# Patient Record
Sex: Female | Born: 1990 | Race: Black or African American | Hispanic: No | Marital: Single | Smoking: Never smoker
Health system: Southern US, Community
[De-identification: ages and names within clinical notes are randomized; demographics above are authoritative.]

## PROBLEM LIST (undated history)

## (undated) ENCOUNTER — Inpatient Hospital Stay (HOSPITAL_COMMUNITY): Payer: Self-pay

## (undated) DIAGNOSIS — A64 Unspecified sexually transmitted disease: Secondary | ICD-10-CM

## (undated) DIAGNOSIS — R7303 Prediabetes: Secondary | ICD-10-CM

## (undated) DIAGNOSIS — D649 Anemia, unspecified: Secondary | ICD-10-CM

## (undated) DIAGNOSIS — N760 Acute vaginitis: Secondary | ICD-10-CM

## (undated) DIAGNOSIS — R87629 Unspecified abnormal cytological findings in specimens from vagina: Secondary | ICD-10-CM

## (undated) DIAGNOSIS — B9689 Other specified bacterial agents as the cause of diseases classified elsewhere: Secondary | ICD-10-CM

## (undated) DIAGNOSIS — A599 Trichomoniasis, unspecified: Secondary | ICD-10-CM

## (undated) HISTORY — DX: Trichomoniasis, unspecified: A59.9

## (undated) HISTORY — DX: Unspecified abnormal cytological findings in specimens from vagina: R87.629

## (undated) HISTORY — DX: Other specified bacterial agents as the cause of diseases classified elsewhere: B96.89

## (undated) HISTORY — DX: Unspecified sexually transmitted disease: A64

## (undated) HISTORY — DX: Acute vaginitis: N76.0

---

## 2013-09-15 ENCOUNTER — Emergency Department (INDEPENDENT_AMBULATORY_CARE_PROVIDER_SITE_OTHER)
Admission: EM | Admit: 2013-09-15 | Discharge: 2013-09-15 | Disposition: A | Discharge status: Home / Self Care | Payer: Self-pay | Source: Home / Self Care

## 2013-09-15 ENCOUNTER — Encounter (HOSPITAL_COMMUNITY): Payer: Self-pay | Admitting: Emergency Medicine

## 2013-09-15 ENCOUNTER — Other Ambulatory Visit (HOSPITAL_COMMUNITY)
Admission: RE | Admit: 2013-09-15 | Discharge: 2013-09-15 | Disposition: A | Discharge status: Home / Self Care | Payer: Self-pay | Source: Ambulatory Visit | Attending: Family Medicine | Admitting: Family Medicine

## 2013-09-15 DIAGNOSIS — N941 Unspecified dyspareunia: Secondary | ICD-10-CM

## 2013-09-15 DIAGNOSIS — N760 Acute vaginitis: Secondary | ICD-10-CM | POA: Insufficient documentation

## 2013-09-15 DIAGNOSIS — IMO0002 Reserved for concepts with insufficient information to code with codable children: Secondary | ICD-10-CM

## 2013-09-15 DIAGNOSIS — N898 Other specified noninflammatory disorders of vagina: Secondary | ICD-10-CM

## 2013-09-15 DIAGNOSIS — Z113 Encounter for screening for infections with a predominantly sexual mode of transmission: Secondary | ICD-10-CM | POA: Insufficient documentation

## 2013-09-15 DIAGNOSIS — Z202 Contact with and (suspected) exposure to infections with a predominantly sexual mode of transmission: Secondary | ICD-10-CM

## 2013-09-15 LAB — POCT PREGNANCY, URINE: PREG TEST UR: NEGATIVE

## 2013-09-15 LAB — POCT URINALYSIS DIP (DEVICE)
Bilirubin Urine: NEGATIVE
Glucose, UA: NEGATIVE mg/dL
Ketones, ur: NEGATIVE mg/dL
LEUKOCYTES UA: NEGATIVE
NITRITE: NEGATIVE
PROTEIN: NEGATIVE mg/dL
Specific Gravity, Urine: 1.02 (ref 1.005–1.030)
UROBILINOGEN UA: 0.2 mg/dL (ref 0.0–1.0)
pH: 5.5 (ref 5.0–8.0)

## 2013-09-15 MED ORDER — AZITHROMYCIN 250 MG PO TABS
ORAL_TABLET | ORAL | Status: AC
Start: 2013-09-15 — End: 2013-09-15
  Filled 2013-09-15: qty 4

## 2013-09-15 MED ORDER — AZITHROMYCIN 250 MG PO TABS
1000.0000 mg | ORAL_TABLET | Freq: Every day | ORAL | Status: DC
  Administered 2013-09-15: 1000 mg via ORAL

## 2013-09-15 MED ORDER — LIDOCAINE HCL (PF) 1 % IJ SOLN
INTRAMUSCULAR | Status: AC
  Filled 2013-09-15: qty 5

## 2013-09-15 MED ORDER — METRONIDAZOLE 500 MG PO TABS: 500.0000 mg | ORAL_TABLET | Freq: Two times a day (BID) | ORAL | Status: DC

## 2013-09-15 MED ORDER — CEFTRIAXONE SODIUM 250 MG IJ SOLR
250.0000 mg | Freq: Once | INTRAMUSCULAR | Status: AC
  Administered 2013-09-15: 250 mg via INTRAMUSCULAR

## 2013-09-15 MED ORDER — CEFTRIAXONE SODIUM 250 MG IJ SOLR
INTRAMUSCULAR | Status: AC
  Filled 2013-09-15: qty 250

## 2013-09-15 NOTE — ED Notes (Signed)
C/o lower left pelvic pain.  Vaginal discharge with a fishy odor.  X 2 days.    Denies fever, n/v/d

## 2013-09-15 NOTE — ED Provider Notes (Signed)
CSN: [id number]     Arrival date & time 09/15/13  1230 History   First MD Initiated Contact with Patient 09/15/13 1306     Chief Complaint  Patient presents with  . Pelvic Pain  . Vaginal Discharge   (Consider location/radiation/quality/duration/timing/severity/associated sxs/prior Treatment) HPI Comments: 23 year old female complaining of blood was vaginal discharge off and on for the past several weeks. She has been to the [facility] Department as well as a clinic in [place]. She states the discharge is malodorous. She is also complaining of pain across the pelvis for the past 24 hours. She has also complained of this. Any with the last several intercourse activities.   History reviewed. No pertinent past medical history. History reviewed. No pertinent past surgical history. History reviewed. No pertinent family history. History  Substance Use Topics  . Smoking status: Never Smoker   . Smokeless tobacco: Not on file  . Alcohol Use: No   OB History   Grav Para Term Preterm Abortions TAB SAB Ect Mult Living                 Review of Systems  Constitutional: Negative.   Respiratory: Negative.   Gastrointestinal: Negative.   Genitourinary: Positive for vaginal discharge, pelvic pain and dyspareunia. Negative for dysuria, frequency, flank pain, vaginal bleeding and menstrual problem.    Allergies  Review of patient's allergies indicates not on file.  Home Medications   Prior to Admission medications   Medication Sig Start Date End Date Taking? Authorizing Provider  metroNIDAZOLE (FLAGYL) 500 MG tablet Take 1 tablet (500 mg total) by mouth 2 (two) times daily. X 7 days 09/15/13   Hayden Rasmussenavid Glenard Keesling, NP   BP 108/57  Pulse 72  Temp(Src) 98.5 F (36.9 C) (Oral)  Resp 16  SpO2 100%  LMP 08/28/2013 Physical Exam  Nursing note and vitals reviewed. Constitutional: She is oriented to person, place, and time. She appears well-developed and well-nourished. No distress.  Neck: Normal range of  motion. Neck supple.  Cardiovascular: Normal rate and regular rhythm.   Pulmonary/Chest: Effort normal. No respiratory distress.  Abdominal: Soft. Bowel sounds are normal. She exhibits no distension and no mass. There is no tenderness. There is no rebound and no guarding.  Minor tenderness to palpation across the anterior pelvis.  Genitourinary:  Normal external female genitalia Cervix midline and mildly posterior, easily captured. There is a thick clear mucoid discharge surrounding the cervix and in the os as well as a small amount of white creamy discharge in the vaginal vault. At the border of the ectocervix there is a smooth discolored raised lesion at 4 and 5:00. Not a polyp. Positive CMT, positive bilateral adnexal tenderness.  Neurological: She is alert and oriented to person, place, and time.  Skin: Skin is warm and dry.  Psychiatric: She has a normal mood and affect.    ED Course  Procedures (including critical care time) Labs Review Labs Reviewed  CERVICOVAGINAL ANCILLARY ONLY    Imaging Review No results found. No results found for this or any previous visit.   MDM   1. PID (pelvic inflammatory disease) contact, untreated   2. Dyspareunia, female   3. Vaginal discharge     Rocephin 250 mg IM Azithromycin 1 g by mouth Rx for Flagyl 500 twice a day Cervical cytology pending Tried to obtain PCP: Number above.    Hayden Rasmussenavid Mahin Guardia, NP 09/15/13 1353  Hayden Rasmussenavid Jaksen Fiorella, NP 09/15/13 1354

## 2013-09-15 NOTE — Discharge Instructions (Signed)
Sexually Transmitted Disease °A sexually transmitted disease (STD) is a disease or infection that may be passed (transmitted) from person to person, usually during sexual activity. This may happen by way of saliva, semen, blood, vaginal mucus, or urine. Common STDs include:  °· Gonorrhea.   °· Chlamydia.   °· Syphilis.   °· HIV and AIDS.   °· Genital herpes.   °· Hepatitis B and C.   °· Trichomonas.   °· Human papillomavirus (HPV).   °· Pubic lice.   °· Scabies. °· Mites. °· Bacterial vaginosis. °WHAT ARE CAUSES OF STDs? °An STD may be caused by bacteria, a virus, or parasites. STDs are often transmitted during sexual activity if one person is infected. However, they may also be transmitted through nonsexual means. STDs may be transmitted after:  °· Sexual intercourse with an infected person.   °· Sharing sex toys with an infected person.   °· Sharing needles with an infected person or using unclean piercing or tattoo needles. °· Having intimate contact with the genitals, mouth, or rectal areas of an infected person.   °· Exposure to infected fluids during birth. °WHAT ARE THE SIGNS AND SYMPTOMS OF STDs? °Different STDs have different symptoms. Some people may not have any symptoms. If symptoms are present, they may include:  °· Painful or bloody urination.   °· Pain in the pelvis, abdomen, vagina, anus, throat, or eyes.   °· A skin rash, itching, or irritation. °· Growths, ulcerations, blisters, or sores in the genital and anal areas. °· Abnormal vaginal discharge with or without bad odor.   °· Penile discharge in men.   °· Fever.   °· Pain or bleeding during sexual intercourse.   °· Swollen glands in the groin area.   °· Yellow skin and eyes (jaundice). This is seen with hepatitis.   °· Swollen testicles. °· Infertility. °· Sores and blisters in the mouth. °HOW ARE STDs DIAGNOSED? °To make a diagnosis, your health care provider may:  °· Take a medical history.   °· Perform a physical exam.   °· Take a sample of  any discharge to examine. °· Swab the throat, cervix, opening to the penis, rectum, or vagina for testing. °· Test a sample of your first morning urine.   °· Perform blood tests.   °· Perform a Pap test, if this applies.   °· Perform a colposcopy.   °· Perform a laparoscopy.   °HOW ARE STDs TREATED? ° Treatment depends on the STD. Some STDs may be treated but not cured.  °· Chlamydia, gonorrhea, trichomonas, and syphilis can be cured with antibiotic medicine.   °· Genital herpes, hepatitis, and HIV can be treated, but not cured, with prescribed medicines. The medicines lessen symptoms.   °· Genital warts from HPV can be treated with medicine or by freezing, burning (electrocautery), or surgery. Warts may come back.   °· HPV cannot be cured with medicine or surgery. However, abnormal areas may be removed from the cervix, vagina, or vulva.   °· If your diagnosis is confirmed, your recent sexual partners need treatment. This is true even if they are symptom-free or have a negative culture or evaluation. They should not have sex until their health care providers say it is okay. °HOW CAN I REDUCE MY RISK OF GETTING AN STD? °Take these steps to reduce your risk of getting an STD: °· Use latex condoms, dental dams, and water-soluble lubricants during sexual activity. Do not use petroleum jelly or oils. °· Avoid having multiple sex partners. °· Do not have sex with someone who has other sex partners. °· Do not have sex with anyone you do not know or who is at   high risk for an STD. °· Avoid risky sex practices that can break your skin. °· Do not have sex if you have open sores on your mouth or skin. °· Avoid drinking too much alcohol or taking illegal drugs. Alcohol and drugs can affect your judgment and put you in a vulnerable position. °· Avoid engaging in oral and anal sex acts. °· Get vaccinated for HPV and hepatitis. If you have not received these vaccines in the past, talk to your health care provider about whether one  or both might be right for you.   °· If you are at risk of being infected with HIV, it is recommended that you take a prescription medicine daily to prevent HIV infection. This is called pre-exposure prophylaxis (PrEP). You are considered at risk if: °¨ You are a man who has sex with other men (MSM). °¨ You are a heterosexual man or woman and are sexually active with more than one partner. °¨ You take drugs by injection. °¨ You are sexually active with a partner who has HIV. °· Talk with your health care provider about whether you are at high risk of being infected with HIV. If you choose to begin PrEP, you should first be tested for HIV. You should then be tested every 3 months for as long as you are taking PrEP.   °WHAT SHOULD I DO IF I THINK I HAVE AN STD? °· See your health care provider.   °· Tell your sexual partner(s). They should be tested and treated for any STDs. °· Do not have sex until your health care provider says it is okay.  °WHEN SHOULD I GET IMMEDIATE MEDICAL CARE? °Contact your health care provider right away if:  °· You have severe abdominal pain. °· You are a man and notice swelling or pain in your testicles. °· You are a woman and notice swelling or pain in your vagina. °Document Released: 05/23/2002 Document Revised: 03/07/2013 Document Reviewed: 09/20/2012 °[employer]® Patient Information ©2015 [employer]. This information is not intended to replace advice given to you by your health care provider. Make sure you discuss any questions you have with your health care provider. ° °Vaginitis °Vaginitis is an inflammation of the vagina. It is most often caused by a change in the normal balance of the bacteria and yeast that live in the vagina. This change in balance causes an overgrowth of certain bacteria or yeast, which causes the inflammation. There are different types of vaginitis, but the most common types are: °· Bacterial vaginosis. °· Yeast infection (candidiasis). °· Trichomoniasis  vaginitis. This is a sexually transmitted infection (STI). °· Viral vaginitis. °· Atropic vaginitis. °· Allergic vaginitis. °CAUSES  °The cause depends on the type of vaginitis. Vaginitis can be caused by: °· Bacteria (bacterial vaginosis). °· Yeast (yeast infection). °· A parasite (trichomoniasis vaginitis) °· A virus (viral vaginitis). °· Low hormone levels (atrophic vaginitis). Low hormone levels can occur during pregnancy, breastfeeding, or after menopause. °· Irritants, such as bubble baths, scented tampons, and feminine sprays (allergic vaginitis). °Other factors can change the normal balance of the yeast and bacteria that live in the vagina. These include: °· Antibiotic medicines. °· Poor hygiene. °· Diaphragms, vaginal sponges, spermicides, birth control pills, and intrauterine devices (IUD). °· Sexual intercourse. °· Infection. °· Uncontrolled diabetes. °· A weakened immune system. °SYMPTOMS  °Symptoms can vary depending on the cause of the vaginitis. Common symptoms include: °· Abnormal vaginal discharge. °¨ The discharge is white, gray, or yellow with bacterial vaginosis. °¨ The discharge   is thick, white, and cheesy with a yeast infection. °¨ The discharge is frothy and yellow or greenish with trichomoniasis. °· A bad vaginal odor. °¨ The odor is fishy with bacterial vaginosis. °· Vaginal itching, pain, or swelling. °· Painful intercourse. °· Pain or burning when urinating. °Sometimes, there are no symptoms. °TREATMENT  °Treatment will vary depending on the type of infection.  °· Bacterial vaginosis and trichomoniasis are often treated with antibiotic creams or pills. °· Yeast infections are often treated with antifungal medicines, such as vaginal creams or suppositories. °· Viral vaginitis has no cure, but symptoms can be treated with medicines that relieve discomfort. Your sexual partner should be treated as well. °· Atrophic vaginitis may be treated with an estrogen cream, pill, suppository, or vaginal  ring. If vaginal dryness occurs, lubricants and moisturizing creams may help. You may be told to avoid scented soaps, sprays, or douches. °· Allergic vaginitis treatment involves quitting the use of the product that is causing the problem. Vaginal creams can be used to treat the symptoms. °HOME CARE INSTRUCTIONS  °· Take all medicines as directed by your caregiver. °· Keep your genital area clean and dry. Avoid soap and only rinse the area with water. °· Avoid douching. It can remove the healthy bacteria in the vagina. °· Do not use tampons or have sexual intercourse until your vaginitis has been treated. Use sanitary pads while you have vaginitis. °· Wipe from front to back. This avoids the spread of bacteria from the rectum to the vagina. °· Let air reach your genital area. °¨ Wear cotton underwear to decrease moisture buildup. °¨ Avoid wearing underwear while you sleep until your vaginitis is gone. °¨ Avoid tight pants and underwear or nylons without a cotton panel. °¨ Take off wet clothing (especially bathing suits) as soon as possible. °· Use mild, non-scented products. Avoid using irritants, such as: °¨ Scented feminine sprays. °¨ Fabric softeners. °¨ Scented detergents. °¨ Scented tampons. °¨ Scented soaps or bubble baths. °· Practice safe sex and use condoms. Condoms may prevent the spread of trichomoniasis and viral vaginitis. °SEEK MEDICAL CARE IF:  °· You have abdominal pain. °· You have a fever or persistent symptoms for more than 2-3 days. °· You have a fever and your symptoms suddenly get worse. °Document Released: 12/28/2006 Document Revised: 11/25/2011 Document Reviewed: 08/13/2011 °[employer]® Patient Information ©2015 [employer]. This information is not intended to replace advice given to you by your health care provider. Make sure you discuss any questions you have with your health care provider. ° °

## 2013-09-15 NOTE — ED Provider Notes (Signed)
Medical screening examination/treatment/procedure(s) were performed by resident physician or non-physician practitioner and as supervising physician I was immediately available for consultation/collaboration.   KINDL,JAMES DOUGLAS MD.   James D Kindl, MD 09/15/13 1604 

## 2013-09-18 NOTE — ED Notes (Signed)
GC/Chlamydia neg., Affirm: Candida and Trich neg., Gardnerella pos.  Pt. adequately treated with Flagyl. Vassie MoselleYork, Jerae Izard M 09/18/2013

## 2013-09-19 ENCOUNTER — Telehealth (HOSPITAL_COMMUNITY): Payer: Self-pay | Admitting: *Deleted

## 2013-09-19 NOTE — ED Notes (Signed)
Pt. called for her lab results.  Pt. verified x 2 and given results.  Pt. told she was adequately treated with Flagyl for bacterial vaginosis and to finish all of the medication.  She said she was tender on her exam over her ovary.  She does not have an OB-GYN that she can f/u.  She does not have insurance.  [facility] and gave her a number to call. Vassie MoselleYork, Antwain Caliendo M 09/19/2013

## 2013-09-21 NOTE — ED Notes (Signed)
Called to c/o she is having diarrhea. Advised she should continue to take Rx as prescribed for the gardnerella , and may use imodium OTC medication for loose stools, and is no better, should be rechecked

## 2013-09-25 NOTE — ED Notes (Signed)
Patient called w more questions for her unresolved vaginal issues. Advised to return to care or [facility]

## 2013-10-20 ENCOUNTER — Ambulatory Visit (INDEPENDENT_AMBULATORY_CARE_PROVIDER_SITE_OTHER): Payer: Self-pay | Admitting: Obstetrics & Gynecology

## 2013-10-20 ENCOUNTER — Encounter: Payer: Self-pay | Admitting: Obstetrics & Gynecology

## 2013-10-20 VITALS — BP 115/59 | HR 69 | Temp 98.6°F | Ht 70.0 in | Wt 139.1 lb

## 2013-10-20 DIAGNOSIS — Z3009 Encounter for other general counseling and advice on contraception: Secondary | ICD-10-CM

## 2013-10-20 DIAGNOSIS — N898 Other specified noninflammatory disorders of vagina: Secondary | ICD-10-CM | POA: Insufficient documentation

## 2013-10-20 DIAGNOSIS — N946 Dysmenorrhea, unspecified: Secondary | ICD-10-CM

## 2013-10-20 DIAGNOSIS — Z113 Encounter for screening for infections with a predominantly sexual mode of transmission: Secondary | ICD-10-CM

## 2013-10-20 DIAGNOSIS — Z30011 Encounter for initial prescription of contraceptive pills: Secondary | ICD-10-CM

## 2013-10-20 DIAGNOSIS — IMO0002 Reserved for concepts with insufficient information to code with codable children: Secondary | ICD-10-CM | POA: Insufficient documentation

## 2013-10-20 MED ORDER — NORGESTIMATE-ETH ESTRADIOL 0.25-35 MG-MCG PO TABS: 1.0000 | ORAL_TABLET | Freq: Every day | ORAL | Status: DC

## 2013-10-20 NOTE — Assessment & Plan Note (Signed)
Will check for Hep B surface Ag, HIV, RPR, wet prep, GC/C, pap cytology  Review results at f/u in 3 months

## 2013-10-20 NOTE — Assessment & Plan Note (Signed)
Will resume Sprintec to help with symptoms disc'ed IUD (can help with dysmenorrhea sx's and irregularity), Nexplenon, oral contraceptive pills, Depo. Pt prefers oral contraceptive pill F/u in 3 months and review compliance with oral contraceptive pills and if helped with relief of sx's of menstrual cycle and dyspareunia

## 2013-10-20 NOTE — Assessment & Plan Note (Signed)
Will discuss at next visit as well encouraged use of lubrication

## 2013-10-20 NOTE — Patient Instructions (Signed)
Sexually Transmitted Disease  A sexually transmitted disease (STD) is a disease or infection that may be passed (transmitted) from person to person, usually during sexual activity. This may happen by way of saliva, semen, blood, vaginal mucus, or urine. Common STDs include:   · Gonorrhea.    · Chlamydia.    · Syphilis.    · HIV and AIDS.    · Genital herpes.    · Hepatitis B and C.    · Trichomonas.    · Human papillomavirus (HPV).    · Pubic lice.    · Scabies.  · Mites.  · Bacterial vaginosis.  WHAT ARE CAUSES OF STDs?  An STD may be caused by bacteria, a virus, or parasites. STDs are often transmitted during sexual activity if one person is infected. However, they may also be transmitted through nonsexual means. STDs may be transmitted after:   · Sexual intercourse with an infected person.    · Sharing sex toys with an infected person.    · Sharing needles with an infected person or using unclean piercing or tattoo needles.  · Having intimate contact with the genitals, mouth, or rectal areas of an infected person.    · Exposure to infected fluids during birth.  WHAT ARE THE SIGNS AND SYMPTOMS OF STDs?  Different STDs have different symptoms. Some people may not have any symptoms. If symptoms are present, they may include:   · Painful or bloody urination.    · Pain in the pelvis, abdomen, vagina, anus, throat, or eyes.    · A skin rash, itching, or irritation.  · Growths, ulcerations, blisters, or sores in the genital and anal areas.  · Abnormal vaginal discharge with or without bad odor.    · Penile discharge in men.    · Fever.    · Pain or bleeding during sexual intercourse.    · Swollen glands in the groin area.    · Yellow skin and eyes (jaundice). This is seen with hepatitis.    · Swollen testicles.  · Infertility.  · Sores and blisters in the mouth.  HOW ARE STDs DIAGNOSED?  To make a diagnosis, your health care provider may:   · Take a medical history.    · Perform a physical exam.    · Take a sample of  any discharge to examine.  · Swab the throat, cervix, opening to the penis, rectum, or vagina for testing.  · Test a sample of your first morning urine.    · Perform blood tests.    · Perform a Pap test, if this applies.    · Perform a colposcopy.    · Perform a laparoscopy.    HOW ARE STDs TREATED?   Treatment depends on the STD. Some STDs may be treated but not cured.   · Chlamydia, gonorrhea, trichomonas, and syphilis can be cured with antibiotic medicine.    · Genital herpes, hepatitis, and HIV can be treated, but not cured, with prescribed medicines. The medicines lessen symptoms.    · Genital warts from HPV can be treated with medicine or by freezing, burning (electrocautery), or surgery. Warts may come back.    · HPV cannot be cured with medicine or surgery. However, abnormal areas may be removed from the cervix, vagina, or vulva.    · If your diagnosis is confirmed, your recent sexual partners need treatment. This is true even if they are symptom-free or have a negative culture or evaluation. They should not have sex until their health care providers say it is okay.  HOW CAN I REDUCE MY RISK OF GETTING AN STD?  Take these steps to reduce your risk of   getting an STD:  · Use latex condoms, dental dams, and water-soluble lubricants during sexual activity. Do not use petroleum jelly or oils.  · Avoid having multiple sex partners.  · Do not have sex with someone who has other sex partners.  · Do not have sex with anyone you do not know or who is at high risk for an STD.  · Avoid risky sex practices that can break your skin.  · Do not have sex if you have open sores on your mouth or skin.  · Avoid drinking too much alcohol or taking illegal drugs. Alcohol and drugs can affect your judgment and put you in a vulnerable position.  · Avoid engaging in oral and anal sex acts.  · Get vaccinated for HPV and hepatitis. If you have not received these vaccines in the past, talk to your health care provider about whether one  or both might be right for you.    · If you are at risk of being infected with HIV, it is recommended that you take a prescription medicine daily to prevent HIV infection. This is called pre-exposure prophylaxis (PrEP). You are considered at risk if:  ¨ You are a man who has sex with other men (MSM).  ¨ You are a heterosexual man or woman and are sexually active with more than one partner.  ¨ You take drugs by injection.  ¨ You are sexually active with a partner who has HIV.  · Talk with your health care provider about whether you are at high risk of being infected with HIV. If you choose to begin PrEP, you should first be tested for HIV. You should then be tested every 3 months for as long as you are taking PrEP.    WHAT SHOULD I DO IF I THINK I HAVE AN STD?  · See your health care provider.    · Tell your sexual partner(s). They should be tested and treated for any STDs.  · Do not have sex until your health care provider says it is okay.   WHEN SHOULD I GET IMMEDIATE MEDICAL CARE?  Contact your health care provider right away if:   · You have severe abdominal pain.  · You are a man and notice swelling or pain in your testicles.  · You are a woman and notice swelling or pain in your vagina.  Document Released: 05/23/2002 Document Revised: 03/07/2013 Document Reviewed: 09/20/2012  ExitCare® Patient Information ©2015 [employer]. This information is not intended to replace advice given to you by your health care provider. Make sure you discuss any questions you have with your health care provider.  Levonorgestrel intrauterine device (IUD)  What is this medicine?  LEVONORGESTREL IUD (LEE voe nor jes trel) is a contraceptive (birth control) device. The device is placed inside the uterus by a healthcare professional. It is used to prevent pregnancy and can also be used to treat heavy bleeding that occurs during your period. Depending on the device, it can be used for 3 to 5 years.  This medicine may be used for other  purposes; ask your health care provider or pharmacist if you have questions.  COMMON BRAND NAME(S): LILETTA, Mirena, Skyla  What should I tell my health care provider before I take this medicine?  They need to know if you have any of these conditions:  -abnormal Pap smear  -cancer of the breast, uterus, or cervix  -diabetes  -endometritis  -genital or pelvic infection now or in the past  -have more   than one sexual partner or your partner has more than one partner  -heart disease  -history of an ectopic or tubal pregnancy  -immune system problems  -IUD in place  -liver disease or tumor  -problems with blood clots or take blood-thinners  -use intravenous drugs  -uterus of unusual shape  -vaginal bleeding that has not been explained  -an unusual or allergic reaction to levonorgestrel, other hormones, silicone, or polyethylene, medicines, foods, dyes, or preservatives  -pregnant or trying to get pregnant  -breast-feeding  How should I use this medicine?  This device is placed inside the uterus by a health care professional.  Talk to your pediatrician regarding the use of this medicine in children. Special care may be needed.  Overdosage: If you think you have taken too much of this medicine contact a poison control center or emergency room at once.  NOTE: This medicine is only for you. Do not share this medicine with others.  What if I miss a dose?  This does not apply.  What may interact with this medicine?  Do not take this medicine with any of the following medications:  -amprenavir  -bosentan  -fosamprenavir  This medicine may also interact with the following medications:  -aprepitant  -barbiturate medicines for inducing sleep or treating seizures  -bexarotene  -griseofulvin  -medicines to treat seizures like carbamazepine, ethotoin, felbamate, oxcarbazepine, phenytoin, topiramate  -modafinil  -pioglitazone  -rifabutin  -rifampin  -rifapentine  -some medicines to treat HIV infection like atazanavir, indinavir,  lopinavir, nelfinavir, tipranavir, ritonavir  -St. John's wort  -warfarin  This list may not describe all possible interactions. Give your health care provider a list of all the medicines, herbs, non-prescription drugs, or dietary supplements you use. Also tell them if you smoke, drink alcohol, or use illegal drugs. Some items may interact with your medicine.  What should I watch for while using this medicine?  Visit your doctor or health care professional for regular check ups. See your doctor if you or your partner has sexual contact with others, becomes HIV positive, or gets a sexual transmitted disease.  This product does not protect you against HIV infection (AIDS) or other sexually transmitted diseases.  You can check the placement of the IUD yourself by reaching up to the top of your vagina with clean fingers to feel the threads. Do not pull on the threads. It is a good habit to check placement after each menstrual period. Call your doctor right away if you feel more of the IUD than just the threads or if you cannot feel the threads at all.  The IUD may come out by itself. You may become pregnant if the device comes out. If you notice that the IUD has come out use a backup birth control method like condoms and call your health care provider.  Using tampons will not change the position of the IUD and are okay to use during your period.  What side effects may I notice from receiving this medicine?  Side effects that you should report to your doctor or health care professional as soon as possible:  -allergic reactions like skin rash, itching or hives, swelling of the face, lips, or tongue  -fever, flu-like symptoms  -genital sores  -high blood pressure  -no menstrual period for 6 weeks during use  -pain, swelling, warmth in the leg  -pelvic pain or tenderness  -severe or sudden headache  -signs of pregnancy  -stomach cramping  -sudden shortness of breath  -trouble

## 2013-10-20 NOTE — Progress Notes (Signed)
Subjective:    Patient ID: Allison Briggs, female    DOB: 08/13/1990, 23 y.o.   MRN: [id number]  HPI Comments: 23 y.o PMH yeast infection, STIs (chlamydia and ?RPR, HPV per pt history). She was previously seen in [place] [facility] and will get records for our records in this clinic.    She presents for follow up from ED/Urgent care visit on 09/15/13: 1. She c/o painful menstrual cycles associated with nausea, vomiting, blood clots, diarrhea, painful cramps, left abdomen/pelvic pain for years.  LMP was last 10/14/13 and lasted 5 days.  She previously tried TriSpintec for 5 months but did not take it consistently.  She prefers oral contraception at this time.   2. She c/o pain with sex in her vaginal and abdominal areas with certain positions.  She also has pelvic/lower (left >right) abdomen pain with or without her menstrual cycles. 3. She c/o white to yellow vaginal discharge intermittently though vaginal discharge has improved since tx'ed with Flagyl bid x 7days for BV and empircically tx'ed for PID with Rocephin, Azithromycin. After completing antibiotic she had vaginal itching for 2 days which is resolved and diarrhea which is resolved.  She wants to make sure bacterial vaginosis is resolved.  She states vaginal discharge changes consistency and color and wants further explanation.   4. She wants an overall STD screen today as well (will check for GC, HIV, Hepatitis BsAg, RPR, wet prep, cytology)  SH:  Recently moved to [place] from [place].  Sexually active and intermittently uses protection (most of the time uses protection)  Recently fired worked at a call center. Currently no insurance coverage  Never been pregnant       Pelvic Pain The patient's primary symptoms include pelvic pain and a vaginal discharge. Episode onset: with certain sexual positions and +/- with menstrual cycle  The problem occurs intermittently. The problem has been waxing and waning. The pain is moderate. The  problem affects both (both but L>R) sides. She is not pregnant. Pertinent negatives include no abdominal pain, diarrhea, nausea or vomiting. Nothing aggravates the symptoms. She has tried nothing for the symptoms. She is sexually active. It is unknown whether or not her partner has an STD. She uses nothing for contraception. Her menstrual history has been irregular (dysmenorrhea ). Her past medical history is significant for an STD. (History of chlamydia, ?RPR)      Review of Systems  Gastrointestinal: Negative for nausea, vomiting, abdominal pain and diarrhea.  Genitourinary: Positive for vaginal discharge, menstrual problem and pelvic pain.       Objective:   Physical Exam  Nursing note and vitals reviewed. Constitutional: She is oriented to person, place, and time. Vital signs are normal. She appears well-developed and well-nourished. She is cooperative. No distress.  HENT:  Head: Normocephalic and atraumatic.  Mouth/Throat: No oropharyngeal exudate.  Eyes: Conjunctivae are normal. Right eye exhibits no discharge. Left eye exhibits no discharge. No scleral icterus.  Cardiovascular: Normal rate, regular rhythm, S1 normal, S2 normal and normal heart sounds.   No murmur heard. No lower extremity edema   Pulmonary/Chest: Effort normal and breath sounds normal. No respiratory distress. She has no wheezes.  Abdominal: Soft. Bowel sounds are normal. There is no tenderness.  Genitourinary: Uterus normal. Pelvic exam was performed with patient supine. Cervix exhibits discharge. Cervix exhibits no motion tenderness. Right adnexum displays no tenderness. Left adnexum displays no tenderness.  Clear vaginal discharge from the os  No significant CMT or adenexal ttp  Neurological: She is alert and oriented to person, place, and time. Gait normal.  Skin: Skin is warm, dry and intact. No rash noted. She is not diaphoretic.  Psychiatric: She has a normal mood and affect. Her speech is normal and  behavior is normal. Judgment and thought content normal. Cognition and memory are normal.          Assessment & Plan:  F/u in 3 months to discuss oral contraception and review STI results.

## 2013-10-20 NOTE — Assessment & Plan Note (Signed)
09/15/13 tx'ed for BV with Flagyl bid x 7 days  Disc'ed vaginal discharge can be physiologic and normal Will screen for STIs again, wet prep

## 2013-10-21 LAB — RPR

## 2013-10-21 LAB — HIV ANTIBODY (ROUTINE TESTING W REFLEX): HIV 1&2 Ab, 4th Generation: NONREACTIVE

## 2013-10-21 LAB — WET PREP, GENITAL
Clue Cells Wet Prep HPF POC: NONE SEEN
TRICH WET PREP: NONE SEEN
WBC, Wet Prep HPF POC: NONE SEEN
Yeast Wet Prep HPF POC: NONE SEEN

## 2013-10-21 LAB — HEPATITIS B SURFACE ANTIGEN: Hepatitis B Surface Ag: NEGATIVE

## 2013-10-23 LAB — CYTOLOGY - PAP

## 2013-10-25 ENCOUNTER — Telehealth: Payer: Self-pay

## 2013-10-25 NOTE — Telephone Encounter (Signed)
Message copied by Louanna RawAMPBELL, Sheza Strickland M on Wed Oct 25, 2013  8:01 AM ------      Message from: Willodean RosenthalHARRAWAY-SMITH, CAROLYN      Created: Tue Oct 24, 2013  5:51 PM       Please call pt. 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      Please schedule.            Thx,      clh-S ------

## 2013-10-25 NOTE — Telephone Encounter (Signed)
Attempted to call patient. No answer. Left message stating we are calling with results, please call clinic. Message sent to admin pool to schedule colpo for patient.

## 2013-10-25 NOTE — Telephone Encounter (Signed)
Message copied by Louanna RawAMPBELL, Adrea Sherpa M on Wed Oct 25, 2013  8:05 AM ------      Message from: Willodean RosenthalHARRAWAY-SMITH, CAROLYN      Created: Tue Oct 24, 2013  5:51 PM       Please call pt. 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      Please schedule.            Thx,      clh-S ------

## 2013-10-27 NOTE — Telephone Encounter (Signed)
Deanna ArtisKeisha called back,notified her of results of pap and gave her colposcopy appointment and gave her results of STD's ( negative) at her request.

## 2013-10-30 NOTE — Progress Notes (Signed)
Patient in need of colpo-- no insurance-- referral to [facility] via email to [employer]rina Holland. Patient to be called with appointment.

## 2013-11-01 ENCOUNTER — Telehealth: Payer: Self-pay

## 2013-11-01 NOTE — Telephone Encounter (Signed)
Scheduled colpo for 12/06/13-- patient already informed. Called Sabrina with [facility], as patient has no insurance. Martie LeeSabrina to call patient later today-- going to keep colpo appointment for now as [facility] may be able to see patient before hand. If not, Sabrina to inform clinic and reschedule colpo appointment for later date. Attempted to call patient to inform her about [facility] and Martie LeeSabrina calling-- no answer-- left message stating we are calling regarding your future appointment in September, please call clinic.

## 2013-11-02 ENCOUNTER — Telehealth: Payer: Self-pay

## 2013-11-02 NOTE — Telephone Encounter (Signed)
Called patient and confirmed that she was aware of appointment with [facility] on 9/15 and then colpo 9/23. Patient verbalized understanding and asked if they would know results right away. Informed patient they would not, however, stated that if the provider sees areas of concern they will biopsy and send off for evaluation-- and plan will depend on results of that. Informed patient provider should be able to give patient idea of what to expect once cervix is visualized with colposcope. Patient verbalized understanding and gratitude. No further questions or concerns.

## 2013-11-22 ENCOUNTER — Telehealth: Payer: Self-pay | Admitting: *Deleted

## 2013-11-22 NOTE — Telephone Encounter (Signed)
Donnice left a message she has questions about birth control she is taking.  Per chart is on sprintec and was started for dysmennorrhea and birth control.   [facility] and she reports her usual period lasts about 4 days and is painful and passes clots. States she started bleeding on 8/30th Sunday and so started taking the pill on that day. States she has been taking it everyday about the same time. State she has bleeding everyday since which is 10 th day now.  States is about same as regular period , no  Heavier and is still painful and having nausea.  States she is anemic- states changing pad about 5 times a day.  We discussed her body is adjusting to the pill and it will probably take until she is on second or even third pack before it really helps with the dysmenorrhea.  I instructed her to keep taking the pill and let us know if her bleeding gets heavier or she starts feeling dizzy or lightheaded. No cbc results available. I also infomred her I would discuss with Dr. Erin Fulling when she is here later today.  And if she has any other orders or instructions I would call her back otherwise keep taking pills as instructed.

## 2013-11-22 NOTE — Telephone Encounter (Signed)
Discussed patient complaints and plan with Dr. Burnice Logan- Katrinka Blazing- no further orders- continue with taking pill as ordered call prn.

## 2013-11-28 ENCOUNTER — Encounter (HOSPITAL_COMMUNITY): Payer: Self-pay

## 2013-11-28 ENCOUNTER — Ambulatory Visit (HOSPITAL_COMMUNITY)
Admission: RE | Admit: 2013-11-28 | Discharge: 2013-11-28 | Disposition: A | Discharge status: Home / Self Care | Payer: Self-pay | Source: Ambulatory Visit | Attending: Obstetrics and Gynecology | Admitting: Obstetrics and Gynecology

## 2013-11-28 VITALS — BP 102/64 | Temp 98.7°F | Ht 70.0 in | Wt 135.0 lb

## 2013-11-28 DIAGNOSIS — R87612 Low grade squamous intraepithelial lesion on cytologic smear of cervix (LGSIL): Secondary | ICD-10-CM | POA: Insufficient documentation

## 2013-11-28 DIAGNOSIS — Z1239 Encounter for other screening for malignant neoplasm of breast: Secondary | ICD-10-CM

## 2013-11-28 NOTE — Progress Notes (Signed)
Patient referred to [facility] by the [facility] due to having an abnormal Pap smear 10/20/2013 that a colposcopy is recommended for follow-up.  Pap Smear:  Pap smear not completed today. Last Pap smear was 10/20/2013 at the [facility] and [facility]. Referred patient to the [facility] for colposcopy to follow-up for abnormal Pap smear.  Appointment scheduled for Wednesday, December 06, 2013 at 1315.  Per patient has a history of one previous abnormal Pap smear 2 years ago that a colposcopy was recommended for follow-up and patient did not follow-up. Last Pap smear result is in EPIC.  Physical exam: Breasts Breasts symmetrical. No skin abnormalities bilateral breasts. No nipple retraction bilateral breasts. No nipple discharge bilateral breasts. No lymphadenopathy. No lumps palpated bilateral breasts. No complaints of pain or tenderness on exam. Screening mammogram recommended at age 62 unless clinically indicated prior.      Pelvic/Bimanual No Pap smear completed today since last Pap smear was 10/20/2013. Pap smear not indicated per [facility] guidelines.

## 2013-11-28 NOTE — Patient Instructions (Signed)
Explained to [name] that she did not need a Pap smear today due to last Pap smear was 10/20/2013. Referred patient to the [facility] for colposcopy to follow-up for abnormal Pap smear.  Appointment scheduled for Wednesday, December 06, 2013 at 1315. Patient aware of appointment and will be there or will call and reschedule if unable to make appointment. Screening mammogram recommended at age 25 unless clinically indicated prior. Allison Briggs verbalized understanding.  Tailynn Armetta, Kathaleen Maser, RN 10:07 AM

## 2013-12-04 ENCOUNTER — Telehealth: Payer: Self-pay | Admitting: *Deleted

## 2013-12-04 NOTE — Telephone Encounter (Signed)
Allison Briggs left a message that was difficult to hear completely due to poor reception- but she stated something about a biopsy to be done Wednesday and requested a call.

## 2013-12-05 NOTE — Telephone Encounter (Signed)
Allison Briggs is having a colposcopy 12/06/13. She states she spoke to someone ,but she was just wanting to make sure if it was ok is she was bleeding to still have her colposcopy- I informed her if she was having heavy period bleeding we could not do the colposcopy- would reschedule- but if just spotting or very light bleeding should be able to do colposcopy

## 2013-12-06 ENCOUNTER — Encounter: Payer: Self-pay | Admitting: Obstetrics & Gynecology

## 2013-12-06 ENCOUNTER — Encounter: Payer: Self-pay | Admitting: *Deleted

## 2013-12-06 NOTE — Progress Notes (Signed)
Patient came in for colposcopy and was having heavy bleeding with clots. Per Dr. Erin Fulling this is too heavy to do colpo. I advised patient that we will need to reschedule. Patient is agreeable to this. Patient states that she is having heavy periods. I advised that she take her pills at the same time every day. Patient stated that she will try to do this. If bleeding doesn't stop by her next pack she will call us back.

## 2014-01-01 ENCOUNTER — Encounter: Payer: Self-pay | Admitting: Nurse Practitioner

## 2014-01-04 ENCOUNTER — Telehealth: Payer: Self-pay

## 2014-01-04 NOTE — Telephone Encounter (Signed)
Patient called stating she has been having heavy bleeding-- she missed 4 days of her OCPs-- and wants to know if this is expected. Requests a call back.   Called patient who states "the day before yesterday my side was hurting really, really, really bad and I had big blood clots." Patient explains that she missed 4 pills in her OCP pack, however not consecutive, and is in her 4th week of pills and is bleeding. States she is not bleeding as heavily as she had been, explains that it was just one night it was heavy but continues to have left side pain/cramping. States she tried ibuprofen once and it didn't really help. Explained to patient that this is likely her normal period. Recommended she take ibuprofen 600-800mg  q 4-6 hours as needed, try heating pads or a warm tub. Advised that should the pain become unbearable or not relieved by any of these recommendations she come to [facility]. Patient verbalized understanding and gratitude. No further questions or concerns.

## 2014-01-09 ENCOUNTER — Telehealth: Payer: Self-pay | Admitting: General Practice

## 2014-01-09 DIAGNOSIS — B379 Candidiasis, unspecified: Secondary | ICD-10-CM

## 2014-01-09 DIAGNOSIS — R195 Other fecal abnormalities: Secondary | ICD-10-CM

## 2014-01-09 MED ORDER — FLUCONAZOLE 150 MG PO TABS: 150.0000 mg | ORAL_TABLET | Freq: Once | ORAL | Status: DC

## 2014-01-09 NOTE — Telephone Encounter (Signed)
Patient called and left message stating she has itching in her vaginal area. States she had BV a month ago and has been using the OTC monistat for 7 days but it hasn't really helped a lot and would like a call back. Diflucan ordered. Called patient, no answer- left message that we are trying to return her phone call if she will call us back at the clinics

## 2014-01-10 NOTE — Telephone Encounter (Signed)
Mychart message sent to patient.

## 2014-02-16 ENCOUNTER — Ambulatory Visit (INDEPENDENT_AMBULATORY_CARE_PROVIDER_SITE_OTHER): Payer: Self-pay | Admitting: Nurse Practitioner

## 2014-02-16 ENCOUNTER — Encounter: Payer: Self-pay | Admitting: Nurse Practitioner

## 2014-02-16 ENCOUNTER — Other Ambulatory Visit (HOSPITAL_COMMUNITY)
Admission: RE | Admit: 2014-02-16 | Discharge: 2014-02-16 | Disposition: A | Discharge status: Home / Self Care | Payer: Self-pay | Source: Ambulatory Visit | Attending: Nurse Practitioner | Admitting: Nurse Practitioner

## 2014-02-16 VITALS — BP 122/61 | HR 71 | Temp 98.2°F | Wt 131.0 lb

## 2014-02-16 DIAGNOSIS — R87612 Low grade squamous intraepithelial lesion on cytologic smear of cervix (LGSIL): Secondary | ICD-10-CM

## 2014-02-16 DIAGNOSIS — N946 Dysmenorrhea, unspecified: Secondary | ICD-10-CM

## 2014-02-16 DIAGNOSIS — Z01812 Encounter for preprocedural laboratory examination: Secondary | ICD-10-CM

## 2014-02-16 LAB — POCT PREGNANCY, URINE: PREG TEST UR: NEGATIVE

## 2014-02-16 MED ORDER — IBUPROFEN 800 MG PO TABS: 800.0000 mg | ORAL_TABLET | Freq: Three times a day (TID) | ORAL | Status: DC | PRN

## 2014-02-16 NOTE — Progress Notes (Signed)
Here for colopsocoy. States was treated for trich and BV 2 weeks ago, BV a month ago

## 2014-02-16 NOTE — Progress Notes (Signed)
    GYNECOLOGY CLINIC COLPOSCOPY PROCEDURE NOTE  23 y.o. G0P0 here for colposcopy for low-grade squamous intraepithelial neoplasia (LGSIL - encompassing HPV,mild dysplasia,CIN I) pap smear on 10/20/13. Discussed role for HPV in cervical dysplasia, need for surveillance.  Patient given informed consent, signed copy in the chart, time out was performed.  Placed in lithotomy position. Cervix viewed with speculum and colposcope after application of acetic acid.   Colposcopy adequate? Yes  visible lesion(s) at 1, 6, 8, 4 o'clock; biopsies obtained.  ECC specimen obtained. All specimens were labelled and sent to pathology.  Patient was given post procedure instructions.  Will follow up pathology and manage accordingly.  Routine preventative health maintenance measures emphasized.    Mable Dara, NP Faculty Practice, [facility]

## 2014-02-16 NOTE — Patient Instructions (Signed)
Colposcopy Colposcopy is a procedure to examine your cervix and vagina, or the area around the outside of your vagina, for abnormalities or signs of disease. The procedure is done using a lighted microscope called a colposcope. Tissue samples may be collected during the colposcopy if your health care provider finds any unusual cells. A colposcopy may be done if a woman has:  An abnormal Pap test. A Pap test is a medical test done to evaluate cells that are on the surface of the cervix.  A Pap test result that is suggestive of human papillomavirus (HPV). This virus can cause genital warts and is linked to the development of cervical cancer.  A sore on her cervix and the results of a Pap test were normal.  Genital warts on the cervix or in or around the outside of the vagina.  A mother who took the drug diethylstilbestrol (DES) while pregnant.  Painful intercourse.  Vaginal bleeding, especially after sexual intercourse. LET YOUR HEALTH CARE PROVIDER KNOW ABOUT:  Any allergies you have.  All medicines you are taking, including vitamins, herbs, eye drops, creams, and over-the-counter medicines.  Previous problems you or members of your family have had with the use of anesthetics.  Any blood disorders you have.  Previous surgeries you have had.  Medical conditions you have. RISKS AND COMPLICATIONS Generally, a colposcopy is a safe procedure. However, as with any procedure, complications can occur. Possible complications include:  Bleeding.  Infection.  Missed lesions. BEFORE THE PROCEDURE   Tell your health care provider if you have your menstrual period. A colposcopy typically is not done during menstruation.  For 24 hours before the colposcopy, do not:  [place].  Use tampons.  Use medicines, creams, or suppositories in the vagina.  Have sexual intercourse. PROCEDURE  During the procedure, you will be lying on your back with your feet in foot rests (stirrups). A warm  metal or plastic instrument (speculum) will be placed in your vagina to keep it open and to allow the health care provider to see the cervix. The colposcope will be placed outside the vagina. It will be used to magnify and examine the cervix, vagina, and the area around the outside of the vagina. A small amount of liquid solution will be placed on the area that is to be viewed. This solution will make it easier to see the abnormal cells. Your health care provider will use tools to suck out mucus and cells from the canal of the cervix. Then he or she will record the location of the abnormal areas. If a biopsy is done during the procedure, a medicine will usually be given to numb the area (local anesthetic). You may feel mild pain or cramping while the biopsy is done. After the procedure, tissue samples collected during the biopsy will be sent to a lab for analysis. AFTER THE PROCEDURE  You will be given instructions on when to follow up with your health care provider for your test results. It is important to keep your appointment. Document Released: 05/23/2002 Document Revised: 11/02/2012 Document Reviewed: 09/29/2012 [employer] Patient Information 2015 [employer]. This information is not intended to replace advice given to you by your health care provider. Make sure you discuss any questions you have with your health care provider.  

## 2014-02-19 ENCOUNTER — Telehealth: Payer: Self-pay | Admitting: *Deleted

## 2014-02-19 NOTE — Telephone Encounter (Signed)
Called patient, no answer- left message stating we are trying to return your phone call, please call us back at the clinics 

## 2014-02-19 NOTE — Telephone Encounter (Signed)
Pt left message stating that she had biopsy on 12/4. She was told that she might have spotting but wants to know if it is normal to have a "thick clump".  Please call back.

## 2014-02-20 NOTE — Telephone Encounter (Signed)
Delbert PhenixLinda M Barefoot, NP  [facility]           Please have her follow up in one year for repeat pap, thanks, Bonita QuinLinda

## 2014-02-20 NOTE — Telephone Encounter (Signed)
Called patient back and she states that she saw a thick clump of that yellow stuff come out the other day but is otherwise fine. Told patient that as long as she isn't experiencing heavy bleeding then that doesn't sound concerning. Informed patient of colpo results and recommendations. Patient verbalized understanding and asked why her ovaries sometimes hurt when she is getting it from behind. Asked patient if this was related to her colpo at all. Patient states no this was before the procedure.Told patient that it can be normal cramp after having rough sex. Patient verbalized understanding and had no other questions

## 2014-02-21 ENCOUNTER — Telehealth: Payer: Self-pay

## 2014-02-21 NOTE — Telephone Encounter (Signed)
Patient called stating she has questions about a procedure and requests a call back. Attempted to contact patient. No answer. Left message stating we are returning your call, please call clinic if you still have questions or concerns.

## 2014-02-23 ENCOUNTER — Telehealth: Payer: Self-pay

## 2014-02-23 NOTE — Telephone Encounter (Signed)
Pt called and stated that she had questions concerning lab results and she thinks she has an infection.  Called pt and left message that we are returning her call and our office is currently closed; if she has concerns to please go to [facility] for evaluation due to our office being closed and closed for the weekend.

## 2014-02-27 NOTE — Telephone Encounter (Signed)
Called patient and left message that if she is still having problems to call us back and leave a detailed message about her symptoms.

## 2014-03-02 ENCOUNTER — Encounter: Payer: Self-pay | Admitting: *Deleted

## 2014-03-05 ENCOUNTER — Telehealth: Payer: Self-pay

## 2014-03-05 NOTE — Telephone Encounter (Signed)
Pt called and stated that she had a biopsy and she has questions concerning pain on her left side.   Called pt and pt informed me that when she has intercourse she may start having pain on her left side when he goes to that side.  I advised pt that a different position would be better or to be gentler.  Pt stated that she has to tell her partner to stop.  Pt also stated that the pain happens during her period on the left side.  I advised pt that it could be a cyst on the ovaries that is common for women to have during periods but usually goes away.  I advised pt that she would need to come in for evaluation and a US may need to be ordered.  I advised pt to come to office to pick up financial assistance paperwork to assist with her healthcare.  Pt stated understanding.  I informed pt to call the front desk to schedule an appt.  Pt agreed.

## 2014-03-16 HISTORY — PX: CERVICAL BIOPSY: SHX590

## 2014-04-29 ENCOUNTER — Encounter (HOSPITAL_COMMUNITY): Payer: Self-pay | Admitting: *Deleted

## 2014-04-29 ENCOUNTER — Emergency Department (INDEPENDENT_AMBULATORY_CARE_PROVIDER_SITE_OTHER)
Admission: EM | Admit: 2014-04-29 | Discharge: 2014-04-29 | Disposition: A | Discharge status: Home / Self Care | Payer: Self-pay | Source: Home / Self Care | Attending: Emergency Medicine | Admitting: Emergency Medicine

## 2014-04-29 DIAGNOSIS — N3001 Acute cystitis with hematuria: Secondary | ICD-10-CM

## 2014-04-29 DIAGNOSIS — J019 Acute sinusitis, unspecified: Secondary | ICD-10-CM

## 2014-04-29 DIAGNOSIS — J309 Allergic rhinitis, unspecified: Secondary | ICD-10-CM

## 2014-04-29 LAB — POCT URINALYSIS DIP (DEVICE)
BILIRUBIN URINE: NEGATIVE
Glucose, UA: NEGATIVE mg/dL
Ketones, ur: NEGATIVE mg/dL
Nitrite: POSITIVE — AB
PH: 7 (ref 5.0–8.0)
PROTEIN: 100 mg/dL — AB
Specific Gravity, Urine: 1.025 (ref 1.005–1.030)
Urobilinogen, UA: 0.2 mg/dL (ref 0.0–1.0)

## 2014-04-29 LAB — POCT PREGNANCY, URINE: Preg Test, Ur: NEGATIVE

## 2014-04-29 MED ORDER — CEPHALEXIN 500 MG PO CAPS: 500.0000 mg | ORAL_CAPSULE | Freq: Three times a day (TID) | ORAL | Status: DC

## 2014-04-29 MED ORDER — CETIRIZINE-PSEUDOEPHEDRINE ER 5-120 MG PO TB12: 1.0000 | ORAL_TABLET | Freq: Every day | ORAL | Status: DC

## 2014-04-29 MED ORDER — FLUTICASONE PROPIONATE 50 MCG/ACT NA SUSP: 2.0000 | Freq: Every day | NASAL | Status: DC

## 2014-04-29 NOTE — Discharge Instructions (Signed)
People who suffer from allergies frequently have symptoms of nasal congestion, runny nose, sneezing, itching of the nose, eyes, ears or throat, mucous in the throat, watering of the eyes and cough.  These symptoms are caused by the body's immune response to environmental allergens.  For seasonal allergies this is pollen (tree pollen in the spring, grass pollen in the summer, and weed pollen in the fall).  Year round allergy symptoms are usually caused by dust or mould.  Many people have year round symptoms which are worse seasonally. ° °For people who have seasonal allergies, pollen avoidance may help to decease symptoms.  This means keeping windows in the house down and windows in the car up.  Run your air conditioning, since this filters out many of the pollen particles.  If you have to spend a prolonged time outdoors during heavy pollen season, it might be prudent to wear a mask.  These can be purchased at any drug store.  When you come in after heavy pollen exposure, your skin, clothing and hair are covered with pollen.  Changing your clothing, taking a shower, and washing your hair may help with your pollen exposure.  Also, your bedding, pillow, and pillowcase may become contaminated with pollen, so frequent washing of your bedding and pillowcase and changing out your pillow may help as well.  (Your pillow can also be a source of dust and mould exposure as well.)  Showering at bedtime may also help. ° °During heavy pollen season (April and September), a large amount of pollen gets trapped in your nasal cavity.  This can contribute to ongoing allergy symptoms.  Saline irrigation of the nasal cavity can help to remove this and relieve allergy symptoms.  This can be accomplished in several ways.  You can mix up your own saline solution using the following recipe:  8 oz of distilled or boiled water, 1/2 tsp of table salt (sodium choride), and a pinch of baking soda (sodium bicarbonate).  If nasal congestion is a  problem.  1 to 2 drops of Afrin solution can be added to this as well.  To do the irrigation, purchase a nasal bulb syringe (the kind you would use to clean out an infant's nose).  Fill this up with the solution, lean you head over a sink with the nostril to be irrigated turned upward, insert the syringe into your nostril, making a tight seal, and gently irrigate, compressing the bulb.  The solution will flow into your nostril and out the other, some may also come out of your mouth.  Repeat this on both sides.  You can do this once daily.  Do not store the solution, mix it up fresh each day.  A commercial solution, called Neomed Solution, can be purchased over the counter without prescription.  You can also use a Netti Pot for irrigation.  These can be purchased at your drug store as well.  Be sure to use distilled or boiled water in these as well and make sure the Netti pot is completely dry between uses. ° °Over the counter medications can be helpful, and in many cases can completely control allergy symptoms without resorting to more expensive prescription meds.   °Antihistamines are the mainstay of allergy treatment.  The newer non-sedating antihistamines are all available over the counter.  These include Allegra, Zyrtec, and Claritin which also can be purchased in their generic forms: fexofenadine, cetirizine, and  Cetirizine.  Combining these meds with a decongestant such as pseudoephedrine or   phenylephrine helps with nasal congestion, but decongestants can also cause elevations in blood pressure.  Pseudoephedrine tends to be more effective than phenylephrine.  The older, more sedating antihistamines such as chlorpheniramine, brompheniramine, and diphenhydramine are also very effective, sometimes more so than the newer antihistamines, but with the price of more sedation.  You should be careful about driving or operating heavy machinery when taking sedating antihistamines, and men with enlarged prostates may  experience urinary retention with diphenhydramine.  Naslacrom nasal spray can be very effective for allergy symptoms.  It is available over the counter and has very few side effects.  The dosage is 2 sprays in each nostril twice daily.  It is recommended that you pinch your nose shut for 30 seconds after using it since it is a watery spray and can run out.  It can be used as long as needed.  There is no risk of dependency.  For people with year round allergies, dust, mould, insect emanations, and pet dander are usually the culprits.  To avoid dust, you need to avoid dust mites which are the main source of allergens in house dust.  Cover your bedding with moisture and mite impervious covers.  These can be purchased at any mattress store.  The modern covers are a little expensive, but not at all uncomfortable. Keeping your house as dry as possible will also help to control dust mites.  Do not use a humidifier and it may help to use a dehumidifier.  Use of a HEPA filter air filter is also a great way to reduce dust and mold exposure.  These units can be purchased commercially.  Make sure to buy one large enough for the room you intend to use it.  Change the filter as per the manufacturer's instructions.  Also, using a HEPA filter vacuum for your carpets is helpful.  There are chemicals that you can sprinkle on your carpet called acaricides that will kill dist mites.  The most commonly used brand is Acarosan.  This can be purchased on line.  It does have to be periodically reapplied.  Wash you pillows and bedsheets regularly in hot water.         Urinary Tract Infection Urinary tract infections (UTIs) can develop anywhere along your urinary tract. Your urinary tract is your body's drainage system for removing wastes and extra water. Your urinary tract includes two kidneys, two ureters, a bladder, and a urethra. Your kidneys are a pair of bean-shaped organs. Each kidney is about the size of your fist.  They are located below your ribs, one on each side of your spine. CAUSES Infections are caused by microbes, which are microscopic organisms, including fungi, viruses, and bacteria. These organisms are so small that they can only be seen through a microscope. Bacteria are the microbes that most commonly cause UTIs. SYMPTOMS  Symptoms of UTIs may vary by age and gender of the patient and by the location of the infection. Symptoms in young women typically include a frequent and intense urge to urinate and a painful, burning feeling in the bladder or urethra during urination. Older women and men are more likely to be tired, shaky, and weak and have muscle aches and abdominal pain. A fever may mean the infection is in your kidneys. Other symptoms of a kidney infection include pain in your back or sides below the ribs, nausea, and vomiting. DIAGNOSIS To diagnose a UTI, your caregiver will ask you about your symptoms. Your caregiver also will ask  to provide a urine sample. The urine sample will be tested for bacteria and white blood cells. White blood cells are made by your body to help fight infection. TREATMENT  Typically, UTIs can be treated with medication. Because most UTIs are caused by a bacterial infection, they usually can be treated with the use of antibiotics. The choice of antibiotic and length of treatment depend on your symptoms and the type of bacteria causing your infection. HOME CARE INSTRUCTIONS  If you were prescribed antibiotics, take them exactly as your caregiver instructs you. Finish the medication even if you feel better after you have only taken some of the medication.  Drink enough water and fluids to keep your urine clear or pale yellow.  Avoid caffeine, tea, and carbonated beverages. They tend to irritate your bladder.  Empty your bladder often. Avoid holding urine for long periods of time.  Empty your bladder before and after sexual intercourse.  After a bowel movement,  women should cleanse from front to back. Use each tissue only once. SEEK MEDICAL CARE IF:   You have back pain.  You develop a fever.  Your symptoms do not begin to resolve within 3 days. SEEK IMMEDIATE MEDICAL CARE IF:   You have severe back pain or lower abdominal pain.  You develop chills.  You have nausea or vomiting.  You have continued burning or discomfort with urination. MAKE SURE YOU:   Understand these instructions.  Will watch your condition.  Will get help right away if you are not doing well or get worse. Document Released: 12/10/2004 Document Revised: 09/01/2011 Document Reviewed: 04/10/2011 [facility] Patient Information 2015 [place], [place]. This information is not intended to replace advice given to you by your health care provider. Make sure you discuss any questions you have with your health care provider.

## 2014-04-29 NOTE — ED Provider Notes (Signed)
Chief Complaint   Urinary Tract Infection and Sore Throat   History of Present Illness   Allison Briggs is a 24 year old female who presents with a one year history of sore throat and appears other upper respiratory symptoms and a 2 day history of dysuria.  She describes a one-year history of sore throat, postnasal drip which is greenish, nasal congestion with clear drainage, sneezing, nasal itching, and itchy, watery eyes. She's had headache and sinus pressure. Her eyes burn, she's had some coughing and wheezing, and history of tonsil stones. She saw her primary care physician for this once, and she was referred to ENT but never did get a chance to go. These symptoms tend to come and go. There no obvious precipitating factors and not related to time of the year, exposure to dust, mold, animals, or any certain foods.  She also has a two-day history of dysuria, frequency, urgency, blood in the urine, and mild left lower quadrant pain. She denies fever, chills, nausea, vomiting, or GYN symptoms. No prior history of UTI. She has had recurrent BV in the past.  Review of Systems     Other than as noted above, the patient denies any of the following symptoms: Systemic:  No fever, chills, fatigue, myalgias, headache, or anorexia. Eye:  No redness, pain or drainage. ENT:  No earache, nasal congestion, rhinorrhea, sinus pressure, or sore throat. Lungs:  No cough, sputum production, wheezing, shortness of breath.  Cardiovascular:  No chest pain, palpitations, or syncope. GI:  No nausea, vomiting, abdominal pain or diarrhea. GU:  No dysuria, frequency, or hematuria. Skin:  No rash or pruritis.   PMFSH     Past medical history, family history, social history, meds, and allergies were reviewed.  She takes Tri-Sprintec. Last menstrual period was February 7. She is sexually active.  Physical Examination    Vital signs:  BP 120/70 mmHg  Pulse 71  Temp(Src) 97.8 F (36.6 C) (Oral)  Resp 16   SpO2 98%  LMP 04/22/2014 (Exact Date) General:  Alert, in no distress. Eye:  PERRL, full EOMs.  Lids and conjunctivas were normal. ENT:  TMs and canals were normal, without erythema or inflammation.  Nasal mucosa was clear and uncongested, without drainage.  Mucous membranes were moist.  Pharynx was clear, without exudate or drainage.  There were no oral ulcerations or lesions. Neck:  Supple, no adenopathy, tenderness or mass. Thyroid was normal. Lungs:  No respiratory distress.  Lungs were clear to auscultation, without wheezes, rales or rhonchi.  Breath sounds were clear and equal bilaterally. Heart:  Regular rhythm, without gallops, murmers or rubs. Abdomen:  Soft, flat, and non-tender to palpation.  No hepatosplenomagaly or mass. Skin:  Clear, warm, and dry, without rash or lesions.  Labs    Results for orders placed or performed during the hospital encounter of 04/29/14  POCT urinalysis dip (device)  Result Value Ref Range   Glucose, UA NEGATIVE NEGATIVE mg/dL   Bilirubin Urine NEGATIVE NEGATIVE   Ketones, ur NEGATIVE NEGATIVE mg/dL   Specific Gravity, Urine 1.025 1.005 - 1.030   Hgb urine dipstick MODERATE (A) NEGATIVE   pH 7.0 5.0 - 8.0   Protein, ur 100 (A) NEGATIVE mg/dL   Urobilinogen, UA 0.2 0.0 - 1.0 mg/dL   Nitrite POSITIVE (A) NEGATIVE   Leukocytes, UA MODERATE (A) NEGATIVE  Pregnancy, urine POC  Result Value Ref Range   Preg Test, Ur NEGATIVE NEGATIVE    The urine was cultured.  Assessment  The primary encounter diagnosis was Acute cystitis with hematuria. Diagnoses of Acute sinusitis, recurrence not specified, unspecified location and Allergic rhinitis, unspecified allergic rhinitis type were also pertinent to this visit.  Advised that she follow-up with an allergy and ENT specialist. She does not have a primary care physician and has no medical insurance, so I have asked [name] to assist her in getting an orange card. From there she can get in to community  health and wellness and get these referrals taken care of.  Plan     1.  Meds:  The following meds were prescribed:   Discharge Medication List as of 04/29/2014  5:12 PM    START taking these medications   Details  cephALEXin (KEFLEX) 500 MG capsule Take 1 capsule (500 mg total) by mouth 3 (three) times daily., Starting 04/29/2014, Until Discontinued, Normal    cetirizine-pseudoephedrine (ZYRTEC-D) 5-120 MG per tablet Take 1 tablet by mouth daily., Starting 04/29/2014, Until Discontinued, Normal    fluticasone (FLONASE) 50 MCG/ACT nasal spray Place 2 sprays into both nostrils daily., Starting 04/29/2014, Until Discontinued, Normal        2.  Patient Education/Counseling:  The patient was given appropriate handouts, self care instructions, and instructed in symptomatic relief.    3.  Follow up:  The patient was told to follow up here if no better in 3 to 4 days, or sooner if becoming worse in any way, and given some red flag symptoms such as fever, vomiting, or severe pain which would prompt immediate return.  Follow up with community health and wellness.        Reuben Likesavid C Jorah Hua, MD 04/29/14 [phone]

## 2014-04-29 NOTE — ED Notes (Signed)
C/O dysuria, frequent urination, and urinary urgency x 2 days - had episode of hematuria 2 days ago.  Also c/o HA, burning eyes, sore throat.  Denies any fevers or flank pain.  C/O slight left low abd pain, and intermittent facial pain.

## 2014-05-02 ENCOUNTER — Telehealth (HOSPITAL_COMMUNITY): Payer: Self-pay | Admitting: Family Medicine

## 2014-05-02 MED ORDER — FLUCONAZOLE 150 MG PO TABS: 150.0000 mg | ORAL_TABLET | Freq: Once | ORAL | Status: DC

## 2014-05-02 NOTE — ED Notes (Signed)
Patient recently was treated with antibiotics for urinary tract infection. She has subsequently developed vaginal itching consistent with previous episodes of yeast infection. She would like fluconazole cold and if possible. [facility]  Rodolph BongEvan S Riaz Onorato, MD 05/02/14 [id number]

## 2014-07-28 ENCOUNTER — Emergency Department (HOSPITAL_COMMUNITY)
Admission: EM | Admit: 2014-07-28 | Discharge: 2014-07-28 | Discharge status: Absent without leave | Payer: Self-pay | Source: Home / Self Care

## 2014-08-23 ENCOUNTER — Emergency Department (HOSPITAL_COMMUNITY)
Admission: EM | Admit: 2014-08-23 | Discharge: 2014-08-23 | Disposition: A | Discharge status: Home / Self Care | Payer: Self-pay | Source: Home / Self Care | Attending: Family Medicine | Admitting: Family Medicine

## 2014-08-23 ENCOUNTER — Other Ambulatory Visit (HOSPITAL_COMMUNITY)
Admission: RE | Admit: 2014-08-23 | Discharge: 2014-08-23 | Disposition: A | Discharge status: Home / Self Care | Payer: Self-pay | Source: Ambulatory Visit | Attending: Family Medicine | Admitting: Family Medicine

## 2014-08-23 ENCOUNTER — Encounter (HOSPITAL_COMMUNITY): Payer: Self-pay | Admitting: Emergency Medicine

## 2014-08-23 DIAGNOSIS — Z113 Encounter for screening for infections with a predominantly sexual mode of transmission: Secondary | ICD-10-CM | POA: Insufficient documentation

## 2014-08-23 DIAGNOSIS — N760 Acute vaginitis: Secondary | ICD-10-CM | POA: Insufficient documentation

## 2014-08-23 LAB — POCT URINALYSIS DIP (DEVICE)
Bilirubin Urine: NEGATIVE
GLUCOSE, UA: NEGATIVE mg/dL
Hgb urine dipstick: NEGATIVE
KETONES UR: NEGATIVE mg/dL
Leukocytes, UA: NEGATIVE
Nitrite: NEGATIVE
Protein, ur: 30 mg/dL — AB
UROBILINOGEN UA: 0.2 mg/dL (ref 0.0–1.0)
pH: 6.5 (ref 5.0–8.0)

## 2014-08-23 NOTE — ED Provider Notes (Signed)
Allison Briggs is a 24 y.o. female who presents to Urgent Care today for vaginal discharge. Patient has recurrent episodes of whitish vaginal discharge associated with vaginal itching. This is been present for a few days. She's been seen recently in the health department for this very issue and treated with metronidazole. She recently had STD testing including HIV gonorrhea and Chlamydia tests that were negative. She denies any abdominal pain fevers or chills.   Past Medical History  Diagnosis Date  . BV (bacterial vaginosis)     09/15/13  . STD (sexually transmitted disease)     h/o chlamydia, RRP  . Trichimoniasis   . Vaginal Pap smear, abnormal    History reviewed. No pertinent past surgical history. History  Substance Use Topics  . Smoking status: Never Smoker   . Smokeless tobacco: Never Used  . Alcohol Use: No   ROS as above Medications: No current facility-administered medications for this encounter.   Current Outpatient Prescriptions  Medication Sig Dispense Refill  . cephALEXin (KEFLEX) 500 MG capsule Take 1 capsule (500 mg total) by mouth 3 (three) times daily. 30 capsule 0  . cetirizine-pseudoephedrine (ZYRTEC-D) 5-120 MG per tablet Take 1 tablet by mouth daily. 30 tablet 5  . fluconazole (DIFLUCAN) 150 MG tablet Take 1 tablet (150 mg total) by mouth once. 1 tablet 1  . fluticasone (FLONASE) 50 MCG/ACT nasal spray Place 2 sprays into both nostrils daily. 16 g 5  . ibuprofen (ADVIL,MOTRIN) 800 MG tablet Take 1 tablet (800 mg total) by mouth every 8 (eight) hours as needed. 60 tablet 1  . metroNIDAZOLE (FLAGYL) 500 MG tablet Take 1 tablet (500 mg total) by mouth 2 (two) times daily. X 7 days 14 tablet 0  . norgestimate-ethinyl estradiol (SPRINTEC 28) 0.25-35 MG-MCG tablet Take 1 tablet by mouth daily. 1 Package 11   No Known Allergies   Exam:  BP 109/64 mmHg  Pulse 82  Temp(Src) 99.5 F (37.5 C) (Oral)  Resp 16  SpO2 100%  LMP 08/03/2014 Gen: Well NAD HEENT: EOMI,   MMM Lungs: Normal work of breathing. CTABL Heart: RRR no MRG Abd: NABS, Soft. Nondistended, Nontender Exts: Brisk capillary refill, warm and well perfused.  GYN: Normal external genitalia. Vaginal canal with thin white discharge normal-appearing cervix. Nontender.  Results for orders placed or performed during the hospital encounter of 08/23/14 (from the past 24 hour(s))  POCT urinalysis dip (device)     Status: Abnormal   Collection Time: 08/23/14  3:09 PM  Result Value Ref Range   Glucose, UA NEGATIVE NEGATIVE mg/dL   Bilirubin Urine NEGATIVE NEGATIVE   Ketones, ur NEGATIVE NEGATIVE mg/dL   Specific Gravity, Urine >=1.030 1.005 - 1.030   Hgb urine dipstick NEGATIVE NEGATIVE   pH 6.5 5.0 - 8.0   Protein, ur 30 (A) NEGATIVE mg/dL   Urobilinogen, UA 0.2 0.0 - 1.0 mg/dL   Nitrite NEGATIVE NEGATIVE   Leukocytes, UA NEGATIVE NEGATIVE   No results found.  Assessment and Plan: 24 y.o. female with vaginitis unclear etiology. Vaginal cytology for gonorrhea Chlamydia BV Trichomonas and yeast pending. Urine culture pending  We'll call in appropriate treatment as needed. Discussed warning signs or symptoms. Please see discharge instructions. Patient expresses understanding.     Rodolph Bong, MD 08/23/14 [id number]

## 2014-08-23 NOTE — ED Notes (Signed)
C/o lower abd pain and yellowish vag d/c x1 week Denies fevers, chills, urinary sx Alert, no signs of acute distress.

## 2014-08-23 NOTE — Discharge Instructions (Signed)
Thank you for coming in today. I am not sure what is causing your symptoms.  We are doing some tests that will come back in a few days.  I will call you with results.   Vaginitis Vaginitis is an inflammation of the vagina. It is most often caused by a change in the normal balance of the bacteria and yeast that live in the vagina. This change in balance causes an overgrowth of certain bacteria or yeast, which causes the inflammation. There are different types of vaginitis, but the most common types are:  Bacterial vaginosis.  Yeast infection (candidiasis).  Trichomoniasis vaginitis. This is a sexually transmitted infection (STI).  Viral vaginitis.  Atropic vaginitis.  Allergic vaginitis. CAUSES  The cause depends on the type of vaginitis. Vaginitis can be caused by:  Bacteria (bacterial vaginosis).  Yeast (yeast infection).  A parasite (trichomoniasis vaginitis)  A virus (viral vaginitis).  Low hormone levels (atrophic vaginitis). Low hormone levels can occur during pregnancy, breastfeeding, or after menopause.  Irritants, such as bubble baths, scented tampons, and feminine sprays (allergic vaginitis). Other factors can change the normal balance of the yeast and bacteria that live in the vagina. These include:  Antibiotic medicines.  Poor hygiene.  Diaphragms, vaginal sponges, spermicides, birth control pills, and intrauterine devices (IUD).  Sexual intercourse.  Infection.  Uncontrolled diabetes.  A weakened immune system. SYMPTOMS  Symptoms can vary depending on the cause of the vaginitis. Common symptoms include:  Abnormal vaginal discharge.  The discharge is white, gray, or yellow with bacterial vaginosis.  The discharge is thick, white, and cheesy with a yeast infection.  The discharge is frothy and yellow or greenish with trichomoniasis.  A bad vaginal odor.  The odor is fishy with bacterial vaginosis.  Vaginal itching, pain, or swelling.  Painful  intercourse.  Pain or burning when urinating. Sometimes, there are no symptoms. TREATMENT  Treatment will vary depending on the type of infection.   Bacterial vaginosis and trichomoniasis are often treated with antibiotic creams or pills.  Yeast infections are often treated with antifungal medicines, such as vaginal creams or suppositories.  Viral vaginitis has no cure, but symptoms can be treated with medicines that relieve discomfort. Your sexual partner should be treated as well.  Atrophic vaginitis may be treated with an estrogen cream, pill, suppository, or vaginal ring. If vaginal dryness occurs, lubricants and moisturizing creams may help. You may be told to avoid scented soaps, sprays, or douches.  Allergic vaginitis treatment involves quitting the use of the product that is causing the problem. Vaginal creams can be used to treat the symptoms. HOME CARE INSTRUCTIONS   Take all medicines as directed by your caregiver.  Keep your genital area clean and dry. Avoid soap and only rinse the area with water.  Avoid douching. It can remove the healthy bacteria in the vagina.  Do not use tampons or have sexual intercourse until your vaginitis has been treated. Use sanitary pads while you have vaginitis.  Wipe from front to back. This avoids the spread of bacteria from the rectum to the vagina.  Let air reach your genital area.  Wear cotton underwear to decrease moisture buildup.  Avoid wearing underwear while you sleep until your vaginitis is gone.  Avoid tight pants and underwear or nylons without a cotton panel.  Take off wet clothing (especially bathing suits) as soon as possible.  Use mild, non-scented products. Avoid using irritants, such as:  Scented feminine sprays.  Fabric softeners.  Scented detergents.  Scented tampons.  Scented soaps or bubble baths.  Practice safe sex and use condoms. Condoms may prevent the spread of trichomoniasis and viral  vaginitis. SEEK MEDICAL CARE IF:   You have abdominal pain.  You have a fever or persistent symptoms for more than 2-3 days.  You have a fever and your symptoms suddenly get worse. Document Released: 12/28/2006 Document Revised: 11/25/2011 Document Reviewed: 08/13/2011 [facility] Patient Information 2015 [place], [place]. This information is not intended to replace advice given to you by your health care provider. Make sure you discuss any questions you have with your health care provider.

## 2014-08-24 LAB — URINE CULTURE
Colony Count: NO GROWTH
Culture: NO GROWTH
Special Requests: NORMAL

## 2014-08-24 LAB — CERVICOVAGINAL ANCILLARY ONLY
CHLAMYDIA, DNA PROBE: NEGATIVE
Neisseria Gonorrhea: NEGATIVE

## 2014-08-25 ENCOUNTER — Encounter (HOSPITAL_COMMUNITY): Payer: Self-pay | Admitting: Emergency Medicine

## 2014-08-25 ENCOUNTER — Emergency Department (HOSPITAL_COMMUNITY)
Admission: EM | Admit: 2014-08-25 | Discharge: 2014-08-25 | Disposition: A | Discharge status: Home / Self Care | Payer: Self-pay | Attending: Emergency Medicine | Admitting: Emergency Medicine

## 2014-08-25 ENCOUNTER — Emergency Department (HOSPITAL_COMMUNITY): Payer: Self-pay

## 2014-08-25 DIAGNOSIS — R102 Pelvic and perineal pain: Secondary | ICD-10-CM | POA: Insufficient documentation

## 2014-08-25 DIAGNOSIS — R1030 Lower abdominal pain, unspecified: Secondary | ICD-10-CM

## 2014-08-25 DIAGNOSIS — R1032 Left lower quadrant pain: Secondary | ICD-10-CM | POA: Insufficient documentation

## 2014-08-25 DIAGNOSIS — Z793 Long term (current) use of hormonal contraceptives: Secondary | ICD-10-CM | POA: Insufficient documentation

## 2014-08-25 DIAGNOSIS — Z3202 Encounter for pregnancy test, result negative: Secondary | ICD-10-CM | POA: Insufficient documentation

## 2014-08-25 DIAGNOSIS — Z8719 Personal history of other diseases of the digestive system: Secondary | ICD-10-CM | POA: Insufficient documentation

## 2014-08-25 DIAGNOSIS — R110 Nausea: Secondary | ICD-10-CM | POA: Insufficient documentation

## 2014-08-25 DIAGNOSIS — Z79899 Other long term (current) drug therapy: Secondary | ICD-10-CM | POA: Insufficient documentation

## 2014-08-25 DIAGNOSIS — N83519 Torsion of ovary and ovarian pedicle, unspecified side: Secondary | ICD-10-CM

## 2014-08-25 DIAGNOSIS — N898 Other specified noninflammatory disorders of vagina: Secondary | ICD-10-CM | POA: Insufficient documentation

## 2014-08-25 LAB — COMPREHENSIVE METABOLIC PANEL
ALK PHOS: 43 U/L (ref 38–126)
ALT: 13 U/L — AB (ref 14–54)
AST: 19 U/L (ref 15–41)
Albumin: 4.3 g/dL (ref 3.5–5.0)
Anion gap: 8 (ref 5–15)
BUN: 11 mg/dL (ref 6–20)
CHLORIDE: 105 mmol/L (ref 101–111)
CO2: 22 mmol/L (ref 22–32)
CREATININE: 0.73 mg/dL (ref 0.44–1.00)
Calcium: 9.2 mg/dL (ref 8.9–10.3)
Glucose, Bld: 97 mg/dL (ref 65–99)
Potassium: 3.7 mmol/L (ref 3.5–5.1)
Sodium: 135 mmol/L (ref 135–145)
Total Bilirubin: 0.7 mg/dL (ref 0.3–1.2)
Total Protein: 7.4 g/dL (ref 6.5–8.1)

## 2014-08-25 LAB — WET PREP, GENITAL
Clue Cells Wet Prep HPF POC: NONE SEEN
Trich, Wet Prep: NONE SEEN
Yeast Wet Prep HPF POC: NONE SEEN

## 2014-08-25 LAB — URINE MICROSCOPIC-ADD ON

## 2014-08-25 LAB — URINALYSIS, ROUTINE W REFLEX MICROSCOPIC
Bilirubin Urine: NEGATIVE
GLUCOSE, UA: NEGATIVE mg/dL
Hgb urine dipstick: NEGATIVE
Ketones, ur: NEGATIVE mg/dL
Nitrite: NEGATIVE
Protein, ur: NEGATIVE mg/dL
Specific Gravity, Urine: 1.024 (ref 1.005–1.030)
Urobilinogen, UA: 0.2 mg/dL (ref 0.0–1.0)
pH: 6 (ref 5.0–8.0)

## 2014-08-25 LAB — CBC WITH DIFFERENTIAL/PLATELET
BASOS ABS: 0 10*3/uL (ref 0.0–0.1)
Basophils Relative: 0 % (ref 0–1)
EOS PCT: 5 % (ref 0–5)
Eosinophils Absolute: 0.3 10*3/uL (ref 0.0–0.7)
HCT: 32 % — ABNORMAL LOW (ref 36.0–46.0)
Hemoglobin: 10.2 g/dL — ABNORMAL LOW (ref 12.0–15.0)
Lymphocytes Relative: 27 % (ref 12–46)
Lymphs Abs: 1.7 10*3/uL (ref 0.7–4.0)
MCH: 25.2 pg — ABNORMAL LOW (ref 26.0–34.0)
MCHC: 31.9 g/dL (ref 30.0–36.0)
MCV: 79.2 fL (ref 78.0–100.0)
Monocytes Absolute: 0.6 10*3/uL (ref 0.1–1.0)
Monocytes Relative: 9 % (ref 3–12)
NEUTROS PCT: 59 % (ref 43–77)
Neutro Abs: 3.9 10*3/uL (ref 1.7–7.7)
PLATELETS: 261 10*3/uL (ref 150–400)
RBC: 4.04 MIL/uL (ref 3.87–5.11)
RDW: 17.7 % — ABNORMAL HIGH (ref 11.5–15.5)
WBC: 6.5 10*3/uL (ref 4.0–10.5)

## 2014-08-25 LAB — LIPASE, BLOOD: LIPASE: 25 U/L (ref 22–51)

## 2014-08-25 LAB — POC URINE PREG, ED: PREG TEST UR: NEGATIVE

## 2014-08-25 MED ORDER — NAPROXEN 500 MG PO TABS: 500.0000 mg | ORAL_TABLET | Freq: Two times a day (BID) | ORAL | Status: DC

## 2014-08-25 NOTE — Discharge Instructions (Signed)
°Emergency Department Resource Guide °1) Find a Doctor and Pay Out of Pocket °Although you won't have to find out who is covered by your insurance plan, it is a good idea to ask around and get recommendations. You will then need to call the office and see if the doctor you have chosen will accept you as a new patient and what types of options they offer for patients who are self-pay. Some doctors offer discounts or will set up payment plans for their patients who do not have insurance, but you will need to ask so you aren't surprised when you get to your appointment. ° °2) Contact Your Local Health Department °Not all health departments have doctors that can see patients for sick visits, but many do, so it is worth a call to see if yours does. If you don't know where your local health department is, you can check in your phone book. The CDC also has a tool to help you locate your state's health department, and many state websites also have listings of all of their local health departments. ° °3) Find a Walk-in Clinic °If your illness is not likely to be very severe or complicated, you may want to try a walk in clinic. These are popping up all over the country in pharmacies, drugstores, and shopping centers. They're usually staffed by nurse practitioners or physician assistants that have been trained to treat common illnesses and complaints. They're usually fairly quick and inexpensive. However, if you have serious medical issues or chronic medical problems, these are probably not your best option. ° °No Primary Care Doctor: °- Call [facility] at  832-8000 - they can help you locate a primary care doctor that  accepts your insurance, provides certain services, etc. °- Physician Referral Service- 1-800-533-3463 ° °Chronic Pain Problems: °Organization         Address  Phone   Notes  °[facility]  (336) 297-2271 Patients need to be referred by their primary care doctor.  ° °Medication  Assistance: °Organization         Address  Phone   Notes  °[place] [facility] [address] °[place], [place] [address] (336) 641-8030 --Must be a resident of [place] °-- Must have NO insurance coverage whatsoever (no Medicaid/ Medicare, etc.) °-- The pt. MUST have a primary care doctor that directs their care regularly and follows them in the community °  °[facility]  (866) 331-1348   °[employer]  (888) 892-1162   ° °Agencies that provide inexpensive medical care: °Organization         Address  Phone   Notes  °[facility]  (336) 832-8035   °[facility]    (336) 832-7272   °[facility] [address] °[place], [place] [address] (336) 832-4777   °[facility] [address], °[place] (336) 271-4999   °[facility]    (336) 373-0678   °[facility]    (336) 272-1050   °[facility] ° [address], [place] Phone:  (336) 832-4444, Fax:  (336) 832-4440 Hours of Operation:  9 am - 6 pm, M-F.  Also accepts Medicaid/Medicare and self-pay.  °[facility] ° [address], [place] Phone: (336) 832-3150, Fax: (336) [phone]. Hours of Operation:  8:30 am - 5:30 pm, M-F.  Also accepts Medicaid and self-pay.  °[facility] [place] [address], [place] Phone: (336) 878-6027   °[facility] [address], [place], [place] (336)723-1848, Ext. 123 Mondays & Thursdays: 7-9 AM.  First 15 patients are seen on a first come, first serve basis. °  ° °Medicaid-accepting [place] Providers: ° °Organization         Address  Phone   Notes  °[facility] [address], [place] (336) 641-2100 Also accepts self-pay patients.  °[facility] [address], [place] ° (336) 856-9996   °[facility] [address], [place]  (336) 288-8857   °[facility] [address] [place] [address], [place] (336) 299-7000   °Veita Bland [address], [place]  ° (336) 373-1557 Only accepts [place] Access Medicaid patients after they have their name applied to their card.  ° °Self-Pay (no insurance) in [place]: ° °Organization         Address  Phone   Notes  °Sickle Cell Patients, [facility] [address], [place] (336) 832-1970   °[facility] [address], [place] (336) 832-4400   °[facility] ° [address], [place] (336) 992-4800   °[facility]/Dr. Osei-Bonsu ° [address] [place] [address], [place] or [address], [place] (336) [phone] Phone number for both [place] and [place] locations is the same.  °[facility] [address], [place] (336) 299-0000   °[facility] [address] [place] [address], [place] or [address] (336) 852-7530 °(336) 878-2260   °[facility] [address], [place] (336) 350-1642, phone; (336) 294-5005, fax Sees patients 1st and 3rd Saturday of every month.  Must not qualify for public or private insurance (i.e. Medicaid, Medicare, [place] Health Choice, Veterans' Benefits) • Household income should be no more than 200% of the poverty level •The clinic cannot treat you if you are pregnant or think you are pregnant • Sexually transmitted diseases are not treated at the clinic.  ° ° °Dental Care: °Organization         Address  Phone  Notes  °[facility] [facility] [address], [place] (336) 641-6152 Accepts children up to age 21 who are enrolled in Medicaid or [place] Health Choice; pregnant women with a Medicaid card; and children who have applied for Medicaid or [place] Health Choice, but were declined, whose parents can pay a reduced fee at time of service.  °[place]  [facility] [place]  [address], [place] (336) 641-7733 Accepts children up to age 21 who are enrolled in Medicaid or [place] Health Choice; pregnant women with a Medicaid card; and children who have applied for Medicaid or [place] Health Choice, but were declined, whose parents can pay a reduced fee at time of service.  °[facility] ° [address], [place] (336) 641-4533 Patients are seen by appointment only. Walk-ins are not accepted. [facility] will see patients 18 years of age and older. °Monday - Tuesday (8am-5pm) °Most Wednesdays (8:30-5pm) °$30 per visit, cash only  °[facility] ° [address], [place] (336) 641-4533 Patients are seen by appointment only. Walk-ins are not accepted. [facility] will see patients 18 years of age and older. °One   Wednesday Evening (Monthly: Volunteer Based).  $30 per visit, cash only  °[facility]  (919) 537-3737 for adults; Children under age 4, call Graduate Pediatric Dentistry at ([phone]) [phone]. Children aged 4-14, please call ([phone]) [phone] to request a pediatric application. ° Dental services are provided in all areas of dental care including fillings, crowns and bridges, complete and partial dentures, implants, gum treatment, root canals, and extractions. Preventive care is also provided. Treatment is provided to both adults and children. °Patients are selected via a lottery and there is often a waiting list. °  °[facility] [address], °[place] ° (336) 763-8833 [email or web address] °  °[facility] [address], [place], [place] (336)723-1848, Ext. 123 Second and Fourth Thursday of each month, opens at 6:30 AM; Clinic ends at 9 AM.  Patients are seen on a first-come first-served basis, and a limited number are seen during each clinic.  ° °[facility] ° [address], [place], [place] (336) 723-7904    Eligibility Requirements °You must have lived in [place], [place], or [place] counties for at least the last three months. °  You cannot be eligible for state or federal sponsored healthcare insurance, including Veterans Administration, Medicaid, or Medicare. °  You generally cannot be eligible for healthcare insurance through your employer.  °  How to apply: °Eligibility screenings are held every Tuesday and Wednesday afternoon from 1:00 pm until 4:00 pm. You do not need an appointment for the interview!  °[facility] [address], [place], [place] 336-631-2330   °[facility]  336-342-8273   °[facility]  336-703-3100   °[facility]  336-570-6415   ° °Behavioral Health Resources in the Community: °Intensive Outpatient Programs °Organization         Address  Phone  Notes  °[facility] [address], [place], [place] 336-878-6098   °[facility] [address], [place], [place] 336-832-9800   °[facility] [address], [place], [place] ° 336-882-2125   °[facility] [address],  °[place], [place] 1-800-853-5163 or 336-641-4981   °Substance Abuse Resources °Organization         Address  Phone  Notes  °[facility]  336-882-2125   °[facility]  336-784-9470   °[facility]  336-285-9073   °[facility]  336-845-3988   °Residential & Outpatient Substance Abuse Program  1-800-659-3381   °Psychological Services °Organization         Address  Phone  Notes  ° Health  336- 832-9600   °[facility]  336- 378-7881   °[facility] [address], [place] 1-800-853-5163 or 336-641-4981   ° °Mobile Crisis Teams °Organization         Address  Phone  Notes  °[facility], Mobile Crisis Care Unit  1-877-626-1772   °Assertive °[facility] ° [address]  [place], [place] 336-834-9664   °Sharon DeEsch [address] ° [place] 336-554-5454   ° °Self-Help/Support Groups °Organization         Address  Phone             Notes  °[facility]  - variety of support groups  336- 373-1402 Call for more information  °Narcotics Anonymous (NA), [facility] [address], °[place]  2 meetings at this location  ° °  Residential Treatment Programs °Organization         Address  Phone  Notes  °[facility] [address],    °[place]  1-866-801-8205   °[facility] ° [address], [place], [place] 704-293-8524   °[facility] [address], [place] 336-845-3988 Admissions: 8am-3pm M-F  °[facility] [address]    °[place], [place] 336-841-1104   °[facility] [address], [place], [place] 336-379-7146   °[facility] [address]  °[place], [place] 336-285-9073   °[facility] Intensive Outpatient [address], [place], [place] 336-852-3033   °[facility] [address]  °[place], [place] 1-877-615-2722 or 336-784-9470   °[facility] [address] [place], [place] 336-227-7417 Accepts Medicaid  °[facility] [address]  °[place] 1-800-659-3381 Substance Abuse/Addiction Treatment  ° °[place] Behavioral Health Resources °Organization         Address  Phone  Notes  °[facility]  (888) 581-9988   °Julie Brannon, PhD [address] [place], [place]   (336) 349-5553 or (336) 951-0000   °[facility]   [address] °[place], [place] (336) 349-4454   °[facility] [address], [place], [place] (336) 342-8316 Insurance/Medicaid/sponsorship through [facility]  °[facility] [address]                                    [place], [place] (336) 342-8316 Therapy/tele-psych/case    °[facility] [address]  ° [place], [place] (336) 349-2233    °Dr. Arfeen  (336) 349-4544   °[facility] [place]  [employer] [place] [facility] 1) [address], [place] °2) [address], [place] °3)  [address], [place] (336) 349-3220 °(336) 342-7768 ° °(336) 342-8140   °[place] [facility] (336) 342-1394 or (336) 342-3537 (After Hours)    ° ° °

## 2014-08-25 NOTE — ED Provider Notes (Signed)
CSN: [id number]     Arrival date & time 08/25/14  1223 History   First MD Initiated Contact with Patient 08/25/14 1343     Chief Complaint  Patient presents with  . Abdominal Pain     (Consider location/radiation/quality/duration/timing/severity/associated sxs/prior Treatment) HPI  Pt is a 24yo female presenting to ED with c/o lower abdominal pain that is sharp and cramping in nature, worse over last 2 days.  Pain is moderate in severity, she has not tried anything for pain. Pain does not feel like regular menstrual cramps.  Pt was seen at urgent care 2 days ago and had a negative test for gonorrhea and chlamydia.  Denies fever, chills, n/v/d. Denies hx of abdominal surgeries. Pt is sexually active and has had an abnormal pap smear in the past. Pt does not currently have an OB/GYN as she states they required $300 copay.   Past Medical History  Diagnosis Date  . BV (bacterial vaginosis)     09/15/13  . STD (sexually transmitted disease)     h/o chlamydia, RRP  . Trichimoniasis   . Vaginal Pap smear, abnormal    History reviewed. No pertinent past surgical history. Family History  Problem Relation Age of Onset  . Hypertension Maternal Grandmother   . Diabetes Maternal Grandmother   . Hypertension Paternal Grandmother   . Diabetes Other     great uncle maternal   History  Substance Use Topics  . Smoking status: Never Smoker   . Smokeless tobacco: Never Used  . Alcohol Use: No   OB History    Gravida Para Term Preterm AB TAB SAB Ectopic Multiple Living   0              Review of Systems  Constitutional: Negative for fever, chills, diaphoresis, appetite change and fatigue.  Respiratory: Negative for cough and shortness of breath.   Gastrointestinal: Positive for nausea and abdominal pain (suprapubic and LLQ). Negative for vomiting, diarrhea and constipation.  Genitourinary: Positive for vaginal discharge and pelvic pain (Left sided). Negative for dysuria, urgency, frequency,  hematuria, flank pain, decreased urine volume, vaginal bleeding, genital sores, vaginal pain and menstrual problem.  All other systems reviewed and are negative.     Allergies  Review of patient's allergies indicates no known allergies.  Home Medications   Prior to Admission medications   Medication Sig Start Date End Date Taking? Authorizing Provider  fluconazole (DIFLUCAN) 150 MG tablet Take 1 tablet (150 mg total) by mouth once. 05/02/14  Yes Rodolph Bong, MD  ibuprofen (ADVIL,MOTRIN) 800 MG tablet Take 1 tablet (800 mg total) by mouth every 8 (eight) hours as needed. 02/16/14  Yes Delbert Phenix, NP  metroNIDAZOLE (FLAGYL) 500 MG tablet Take 1 tablet (500 mg total) by mouth 2 (two) times daily. X 7 days 09/15/13  Yes Hayden Rasmussen, NP  OVER THE COUNTER MEDICATION 1 tablet. Allergy tablet   Yes Historical Provider, MD  cephALEXin (KEFLEX) 500 MG capsule Take 1 capsule (500 mg total) by mouth 3 (three) times daily. Patient not taking: Reported on 08/25/2014 04/29/14   Reuben Likes, MD  cetirizine-pseudoephedrine (ZYRTEC-D) 5-120 MG per tablet Take 1 tablet by mouth daily. Patient not taking: Reported on 08/25/2014 04/29/14   Reuben Likes, MD  fluticasone [facility] 50 MCG/ACT nasal spray Place 2 sprays into both nostrils daily. Patient not taking: Reported on 08/25/2014 04/29/14   Reuben Likes, MD  naproxen (NAPROSYN) 500 MG tablet Take 1 tablet (500 mg total) by  mouth 2 (two) times daily. 08/25/14   Junius Finner, PA-C  norgestimate-ethinyl estradiol (SPRINTEC 28) 0.25-35 MG-MCG tablet Take 1 tablet by mouth daily. Patient not taking: Reported on 08/25/2014 10/20/13   Willodean Rosenthal, MD   BP 105/62 mmHg  Pulse 87  Temp(Src) 98.2 F (36.8 C) (Oral)  Resp 16  SpO2 98%  LMP 08/03/2014 Physical Exam  Constitutional: She appears well-developed and well-nourished. No distress.  HENT:  Head: Normocephalic and atraumatic.  Eyes: Conjunctivae are normal. No scleral icterus.  Neck:  Normal range of motion.  Cardiovascular: Normal rate, regular rhythm and normal heart sounds.   Pulmonary/Chest: Effort normal and breath sounds normal. No respiratory distress. She has no wheezes. She has no rales. She exhibits no tenderness.  Abdominal: Soft. Bowel sounds are normal. She exhibits no distension and no mass. There is tenderness. There is no rebound and no guarding.  Soft, non-distended. Tenderness to suprapubic and LLQ abdomen.  No rebound or guarding  Genitourinary:  Chaperoned exam: normal external genitalia. Vaginal canal-yellow and white vaginal discharge, scant amount red blood. No masses. No CMT, Left sided adnexal tenderness.  Musculoskeletal: Normal range of motion.  Neurological: She is alert.  Skin: Skin is warm and dry. She is not diaphoretic.  Nursing note and vitals reviewed.   ED Course  Procedures (including critical care time) Labs Review Labs Reviewed  WET PREP, GENITAL - Abnormal; Notable for the following:    WBC, Wet Prep HPF POC FEW (*)    All other components within normal limits  CBC WITH DIFFERENTIAL/PLATELET - Abnormal; Notable for the following:    Hemoglobin 10.2 (*)    HCT 32.0 (*)    MCH 25.2 (*)    RDW 17.7 (*)    All other components within normal limits  COMPREHENSIVE METABOLIC PANEL - Abnormal; Notable for the following:    ALT 13 (*)    All other components within normal limits  URINALYSIS, ROUTINE W REFLEX MICROSCOPIC (NOT AT [facility]) - Abnormal; Notable for the following:    APPearance CLOUDY (*)    Leukocytes, UA MODERATE (*)    All other components within normal limits  URINE MICROSCOPIC-ADD ON - Abnormal; Notable for the following:    Squamous Epithelial / LPF MANY (*)    Bacteria, UA MANY (*)    All other components within normal limits  LIPASE, BLOOD  POC URINE PREG, ED    Imaging Review US Transvaginal Non-ob  08/25/2014   CLINICAL DATA:  24 year old female with left pelvic pain for 3 weeks. Negative pregnancy test.   EXAM: TRANSABDOMINAL AND TRANSVAGINAL ULTRASOUND OF PELVIS  DOPPLER ULTRASOUND OF OVARIES  TECHNIQUE: Both transabdominal and transvaginal ultrasound examinations of the pelvis were performed. Transabdominal technique was performed for global imaging of the pelvis including uterus, ovaries, adnexal regions, and pelvic cul-de-sac.  It was necessary to proceed with endovaginal exam following the transabdominal exam to visualize the ovaries and endometrium. Color and duplex Doppler ultrasound was utilized to evaluate blood flow to the ovaries.  COMPARISON:  None.  FINDINGS: Uterus  Measurements: Anteverted measuring 10.1 x 5.4 x 6.2 cm. No fibroids or other myometrial mass visualized.  Endometrium  Thickness: 28 mm and heterogeneous.  Right ovary  Measurements: Unremarkable measuring 3.3 x 2.2 x 3.1 cm. Normal appearance/no adnexal mass.  Left ovary  Measurements: Unremarkable measuring 3.4 x 3 x 3.2 cm. Normal appearance/no adnexal mass.  Pulsed Doppler evaluation of both ovaries demonstrates normal low-resistance arterial and venous waveforms.  Other findings  No free fluid or adnexal mass.  IMPRESSION: Thickened endometrium. Endometrial thickness is considered abnormal. Consider follow-up by [place] in 6-8 weeks, during the week immediately following menses (exam timing is critical).  Normal ovaries.  No evidence of ovarian torsion.   Electronically Signed   By: Harmon Pier M.D.   On: 08/25/2014 15:27   US Pelvis Complete  08/25/2014   CLINICAL DATA:  24 year old female with left pelvic pain for 3 weeks. Negative pregnancy test.  EXAM: TRANSABDOMINAL AND TRANSVAGINAL ULTRASOUND OF PELVIS  DOPPLER ULTRASOUND OF OVARIES  TECHNIQUE: Both transabdominal and transvaginal ultrasound examinations of the pelvis were performed. Transabdominal technique was performed for global imaging of the pelvis including uterus, ovaries, adnexal regions, and pelvic cul-de-sac.  It was necessary to proceed with endovaginal exam following  the transabdominal exam to visualize the ovaries and endometrium. Color and duplex Doppler ultrasound was utilized to evaluate blood flow to the ovaries.  COMPARISON:  None.  FINDINGS: Uterus  Measurements: Anteverted measuring 10.1 x 5.4 x 6.2 cm. No fibroids or other myometrial mass visualized.  Endometrium  Thickness: 28 mm and heterogeneous.  Right ovary  Measurements: Unremarkable measuring 3.3 x 2.2 x 3.1 cm. Normal appearance/no adnexal mass.  Left ovary  Measurements: Unremarkable measuring 3.4 x 3 x 3.2 cm. Normal appearance/no adnexal mass.  Pulsed Doppler evaluation of both ovaries demonstrates normal low-resistance arterial and venous waveforms.  Other findings  No free fluid or adnexal mass.  IMPRESSION: Thickened endometrium. Endometrial thickness is considered abnormal. Consider follow-up by [place] in 6-8 weeks, during the week immediately following menses (exam timing is critical).  Normal ovaries.  No evidence of ovarian torsion.   Electronically Signed   By: Harmon Pier M.D.   On: 08/25/2014 15:27   [place] Art/ven Flow Abd Pelv Doppler  08/25/2014   CLINICAL DATA:  24 year old female with left pelvic pain for 3 weeks. Negative pregnancy test.  EXAM: TRANSABDOMINAL AND TRANSVAGINAL ULTRASOUND OF PELVIS  DOPPLER ULTRASOUND OF OVARIES  TECHNIQUE: Both transabdominal and transvaginal ultrasound examinations of the pelvis were performed. Transabdominal technique was performed for global imaging of the pelvis including uterus, ovaries, adnexal regions, and pelvic cul-de-sac.  It was necessary to proceed with endovaginal exam following the transabdominal exam to visualize the ovaries and endometrium. Color and duplex Doppler ultrasound was utilized to evaluate blood flow to the ovaries.  COMPARISON:  None.  FINDINGS: Uterus  Measurements: Anteverted measuring 10.1 x 5.4 x 6.2 cm. No fibroids or other myometrial mass visualized.  Endometrium  Thickness: 28 mm and heterogeneous.  Right ovary  Measurements:  Unremarkable measuring 3.3 x 2.2 x 3.1 cm. Normal appearance/no adnexal mass.  Left ovary  Measurements: Unremarkable measuring 3.4 x 3 x 3.2 cm. Normal appearance/no adnexal mass.  Pulsed Doppler evaluation of both ovaries demonstrates normal low-resistance arterial and venous waveforms.  Other findings  No free fluid or adnexal mass.  IMPRESSION: Thickened endometrium. Endometrial thickness is considered abnormal. Consider follow-up by [place] in 6-8 weeks, during the week immediately following menses (exam timing is critical).  Normal ovaries.  No evidence of ovarian torsion.   Electronically Signed   By: Harmon Pier M.D.   On: 08/25/2014 15:27     EKG Interpretation None      MDM   Final diagnoses:  Lower abdominal pain   Pt is a 24yo female c/o lower abdominal pain, LLQ abdominal pain.  Pt was seen by UC on 08/23/14, where she had negative gonorrhea and chlamydia.  Will get wet  prep and pelvic U/S in ED.    Labs: unremarkable U/S: significant for thickened endometrium, recommend f/u ultrasound in 6-8 weeks, 1 week immediately following menses for further evaluation.  Pt is safe for discharge home.  No evidence of emergent process taking place at this time. Will have f/u with PCP at [facility], resources provided for [facility] clinic as well as [employer] guide provided. Return precautions provided. Pt verbalized understanding and agreement with tx plan.   Junius Finner, PA-C 08/25/14 1550  Purvis Sheffield, MD 08/26/14 1040

## 2014-08-25 NOTE — ED Notes (Signed)
Pt reports LLQ pain for past week. Pt was seen at [facility] for same 2 days ago. Could not find diagnosis, was told she may need [place] for definitive diagnosis. Pain flares up intermittently, otherwise symptoms the same.

## 2014-08-27 LAB — CERVICOVAGINAL ANCILLARY ONLY: WET PREP (BD AFFIRM): POSITIVE — AB

## 2014-08-30 NOTE — ED Notes (Signed)
Final labs positive for gardnerella . Negative for GC, Chlamydia, yeast, trichomonas

## 2014-09-10 ENCOUNTER — Telehealth: Payer: Self-pay | Admitting: General Practice

## 2014-09-10 NOTE — Telephone Encounter (Signed)
Patient called and left message stating she had a biopsy done in the past from [place]. States she was seen recently for left sided pain and had an ultrasound done and was told that she has endometriosis. Patient states she was informed that she needed another ultrasound in 8-10 weeks after her period. Patient states she has not had her period at all this month and after intercourse yesterday, started to bleed. Patient states she is not bleeding anymore and doesn't know if yesterday was her period or not. Patient requests call back with advice as to what she should do. Called patient stating I am returning her call. Asked patient if she had bleeding like a period after intercourse or just spotting. Patient states just spotting and denies being on birth control. Patient states she cannot be pregnant because she has been checked out by several people recently. Told patient that it doesn't sound like her period and whenever she gets a period in the next few weeks to call us so we can schedule an ultrasound the following week. Patient verbalized understanding and asked why a repeat ultrasound. Told patient that at her most recent ultrasound they couldn't see everything. Told patient that could be because she is about to start a period which is maybe why the ultrasound shows thickened lining in her uterus. Told patient we won't know if that's the case or not unless we do a follow up ultrasound following her period. Patient verbalized understanding and had no other questions

## 2014-09-11 ENCOUNTER — Telehealth: Payer: Self-pay | Admitting: General Practice

## 2014-09-11 NOTE — Telephone Encounter (Signed)
Patient called and left message stating she is having a bloody discharge and doesn't know if this is normal. Patient states she had sex the other day and started bleeding after and now feels weird down there. Patient states she is having a yellowish bloody d/c and already had a period this month. Would like call back with advice. Called patient and recommended she come in for a urine sample to test for gc/ch as that could be a possibility. Patient states she was checked out weeks ago for that and everything was normal. Patient states she isn't sure what is going on because she will bleed one day then stop bleeding and it start back up again and stop. Offered patient appt in our office to be seen by a provider to discuss her concerns at our next available appt in a couple weeks. Patient states she has an appt tomorrow at [facility] to be checked out. Patient states she will go to that visit and see how things turn out and will call us back about an appt. Patient had no other questions

## 2014-09-12 ENCOUNTER — Inpatient Hospital Stay (HOSPITAL_COMMUNITY)
Admission: AD | Admit: 2014-09-12 | Discharge: 2014-09-12 | Disposition: A | Discharge status: Home / Self Care | Payer: Self-pay | Source: Ambulatory Visit | Attending: Obstetrics & Gynecology | Admitting: Obstetrics & Gynecology

## 2014-09-12 ENCOUNTER — Inpatient Hospital Stay (HOSPITAL_COMMUNITY): Payer: Self-pay

## 2014-09-12 ENCOUNTER — Encounter (HOSPITAL_COMMUNITY): Payer: Self-pay | Admitting: *Deleted

## 2014-09-12 DIAGNOSIS — Z3A01 Less than 8 weeks gestation of pregnancy: Secondary | ICD-10-CM | POA: Insufficient documentation

## 2014-09-12 DIAGNOSIS — O209 Hemorrhage in early pregnancy, unspecified: Secondary | ICD-10-CM

## 2014-09-12 DIAGNOSIS — R109 Unspecified abdominal pain: Secondary | ICD-10-CM

## 2014-09-12 DIAGNOSIS — O26899 Other specified pregnancy related conditions, unspecified trimester: Secondary | ICD-10-CM

## 2014-09-12 DIAGNOSIS — R1032 Left lower quadrant pain: Secondary | ICD-10-CM | POA: Insufficient documentation

## 2014-09-12 DIAGNOSIS — O9989 Other specified diseases and conditions complicating pregnancy, childbirth and the puerperium: Secondary | ICD-10-CM

## 2014-09-12 LAB — URINE MICROSCOPIC-ADD ON

## 2014-09-12 LAB — URINALYSIS, ROUTINE W REFLEX MICROSCOPIC
BILIRUBIN URINE: NEGATIVE
Glucose, UA: NEGATIVE mg/dL
Ketones, ur: NEGATIVE mg/dL
NITRITE: NEGATIVE
PH: 7.5 (ref 5.0–8.0)
Protein, ur: NEGATIVE mg/dL
SPECIFIC GRAVITY, URINE: 1.02 (ref 1.005–1.030)
Urobilinogen, UA: 0.2 mg/dL (ref 0.0–1.0)

## 2014-09-12 LAB — HCG, QUANTITATIVE, PREGNANCY: hCG, Beta Chain, Quant, S: 31029 m[IU]/mL — ABNORMAL HIGH (ref <5)

## 2014-09-12 LAB — POCT PREGNANCY, URINE: PREG TEST UR: POSITIVE — AB

## 2014-09-12 NOTE — Discharge Instructions (Signed)
First Trimester of Pregnancy °The first trimester of pregnancy is from week 1 until the end of week 12 (months 1 through 3). A week after a sperm fertilizes an egg, the egg will implant on the wall of the uterus. This embryo will begin to develop into a baby. Genes from you and your partner are forming the baby. The female genes determine whether the baby is a boy or a girl. At 6-8 weeks, the eyes and face are formed, and the heartbeat can be seen on ultrasound. At the end of 12 weeks, all the baby's organs are formed.  °Now that you are pregnant, you will want to do everything you can to have a healthy baby. Two of the most important things are to get good prenatal care and to follow your health care provider's instructions. Prenatal care is all the medical care you receive before the baby's birth. This care will help prevent, find, and treat any problems during the pregnancy and childbirth. °BODY CHANGES °Your body goes through many changes during pregnancy. The changes vary from woman to woman.  °· You may gain or lose a couple of pounds at first. °· You may feel sick to your stomach (nauseous) and throw up (vomit). If the vomiting is uncontrollable, call your health care provider. °· You may tire easily. °· You may develop headaches that can be relieved by medicines approved by your health care provider. °· You may urinate more often. Painful urination may mean you have a bladder infection. °· You may develop heartburn as a result of your pregnancy. °· You may develop constipation because certain hormones are causing the muscles that push waste through your intestines to slow down. °· You may develop hemorrhoids or swollen, bulging veins (varicose veins). °· Your breasts may begin to grow larger and become tender. Your nipples may stick out more, and the tissue that surrounds them (areola) may become darker. °· Your gums may bleed and may be sensitive to brushing and flossing. °· Dark spots or blotches (chloasma,  mask of pregnancy) may develop on your face. This will likely fade after the baby is born. °· Your menstrual periods will stop. °· You may have a loss of appetite. °· You may develop cravings for certain kinds of food. °· You may have changes in your emotions from day to day, such as being excited to be pregnant or being concerned that something may go wrong with the pregnancy and baby. °· You may have more vivid and strange dreams. °· You may have changes in your hair. These can include thickening of your hair, rapid growth, and changes in texture. Some women also have hair loss during or after pregnancy, or hair that feels dry or thin. Your hair will most likely return to normal after your baby is born. °WHAT TO EXPECT AT YOUR PRENATAL VISITS °During a routine prenatal visit: °· You will be weighed to make sure you and the baby are growing normally. °· Your blood pressure will be taken. °· Your abdomen will be measured to track your baby's growth. °· The fetal heartbeat will be listened to starting around week 10 or 12 of your pregnancy. °· Test results from any previous visits will be discussed. °Your health care provider may ask you: °· How you are feeling. °· If you are feeling the baby move. °· If you have had any abnormal symptoms, such as leaking fluid, bleeding, severe headaches, or abdominal cramping. °· If you have any questions. °Other tests   that may be performed during your first trimester include: °· Blood tests to find your blood type and to check for the presence of any previous infections. They will also be used to check for low iron levels (anemia) and Rh antibodies. Later in the pregnancy, blood tests for diabetes will be done along with other tests if problems develop. °· Urine tests to check for infections, diabetes, or protein in the urine. °· An ultrasound to confirm the proper growth and development of the baby. °· An amniocentesis to check for possible genetic problems. °· Fetal screens for  spina bifida and Down syndrome. °· You may need other tests to make sure you and the baby are doing well. °HOME CARE INSTRUCTIONS  °Medicines °· Follow your health care provider's instructions regarding medicine use. Specific medicines may be either safe or unsafe to take during pregnancy. °· Take your prenatal vitamins as directed. °· If you develop constipation, try taking a stool softener if your health care provider approves. °Diet °· Eat regular, well-balanced meals. Choose a variety of foods, such as meat or vegetable-based protein, fish, milk and low-fat dairy products, vegetables, fruits, and whole grain breads and cereals. Your health care provider will help you determine the amount of weight gain that is right for you. °· Avoid raw meat and uncooked cheese. These carry germs that can cause birth defects in the baby. °· Eating four or five small meals rather than three large meals a day may help relieve nausea and vomiting. If you start to feel nauseous, eating a few soda crackers can be helpful. Drinking liquids between meals instead of during meals also seems to help nausea and vomiting. °· If you develop constipation, eat more high-fiber foods, such as fresh vegetables or fruit and whole grains. Drink enough fluids to keep your urine clear or pale yellow. °Activity and Exercise °· Exercise only as directed by your health care provider. Exercising will help you: °¨ Control your weight. °¨ Stay in shape. °¨ Be prepared for labor and delivery. °· Experiencing pain or cramping in the lower abdomen or low back is a good sign that you should stop exercising. Check with your health care provider before continuing normal exercises. °· Try to avoid standing for long periods of time. Move your legs often if you must stand in one place for a long time. °· Avoid heavy lifting. °· Wear low-heeled shoes, and practice good posture. °· You may continue to have sex unless your health care provider directs you  otherwise. °Relief of Pain or Discomfort °· Wear a good support bra for breast tenderness.   °· Take warm sitz baths to soothe any pain or discomfort caused by hemorrhoids. Use hemorrhoid cream if your health care provider approves.   °· Rest with your legs elevated if you have leg cramps or low back pain. °· If you develop varicose veins in your legs, wear support hose. Elevate your feet for 15 minutes, 3-4 times a day. Limit salt in your diet. °Prenatal Care °· Schedule your prenatal visits by the twelfth week of pregnancy. They are usually scheduled monthly at first, then more often in the last 2 months before delivery. °· Write down your questions. Take them to your prenatal visits. °· Keep all your prenatal visits as directed by your health care provider. °Safety °· Wear your seat belt at all times when driving. °· Make a list of emergency phone numbers, including numbers for family, friends, the hospital, and police and fire departments. °General Tips °·   Ask your health care provider for a referral to a local prenatal education class. Begin classes no later than at the beginning of month 6 of your pregnancy. °· Ask for help if you have counseling or nutritional needs during pregnancy. Your health care provider can offer advice or refer you to specialists for help with various needs. °· Do not use hot tubs, steam rooms, or saunas. °· Do not douche or use tampons or scented sanitary pads. °· Do not cross your legs for long periods of time. °· Avoid cat litter boxes and soil used by cats. These carry germs that can cause birth defects in the baby and possibly loss of the fetus by miscarriage or stillbirth. °· Avoid all smoking, herbs, alcohol, and medicines not prescribed by your health care provider. Chemicals in these affect the formation and growth of the baby. °· Schedule a dentist appointment. At home, brush your teeth with a soft toothbrush and be gentle when you floss. °SEEK MEDICAL CARE IF:  °· You have  dizziness. °· You have mild pelvic cramps, pelvic pressure, or nagging pain in the abdominal area. °· You have persistent nausea, vomiting, or diarrhea. °· You have a bad smelling vaginal discharge. °· You have pain with urination. °· You notice increased swelling in your face, hands, legs, or ankles. °SEEK IMMEDIATE MEDICAL CARE IF:  °· You have a fever. °· You are leaking fluid from your vagina. °· You have spotting or bleeding from your vagina. °· You have severe abdominal cramping or pain. °· You have rapid weight gain or loss. °· You vomit blood or material that looks like coffee grounds. °· You are exposed to German measles and have never had them. °· You are exposed to fifth disease or chickenpox. °· You develop a severe headache. °· You have shortness of breath. °· You have any kind of trauma, such as from a fall or a car accident. °Document Released: 02/24/2001 Document Revised: 07/17/2013 Document Reviewed: 01/10/2013 °[employer]® Patient Information ©2015 [employer]. This information is not intended to replace advice given to you by your health care provider. Make sure you discuss any questions you have with your health care provider. °Safe Medications in Pregnancy  ° °Acne: °Benzoyl Peroxide °Salicylic Acid ° °Backache/Headache: °Tylenol: 2 regular strength every 4 hours OR °             2 Extra strength every 6 hours ° °Colds/Coughs/Allergies: °Benadryl (alcohol free) 25 mg every 6 hours as needed °Breath right strips °Claritin °Cepacol throat lozenges °Chloraseptic throat spray °Cold-Eeze- up to three times per day °Cough drops, alcohol free °Flonase (by prescription only) °Guaifenesin °Mucinex °Robitussin DM (plain only, alcohol free) °Saline nasal spray/drops °Sudafed (pseudoephedrine) & Actifed ** use only after [redacted] weeks gestation and if you do not have high blood pressure °Tylenol °Vicks Vaporub °Zinc lozenges °Zyrtec  ° °Constipation: °Colace °Ducolax suppositories °Fleet enema °Glycerin  suppositories °Metamucil °Milk of magnesia °Miralax °Senokot °Smooth move tea ° °Diarrhea: °Kaopectate °Imodium A-D ° °*NO pepto Bismol ° °Hemorrhoids: °Anusol °Anusol HC °Preparation H °Tucks ° °Indigestion: °Tums °Maalox °Mylanta °Zantac  °Pepcid ° °Insomnia: °Benadryl (alcohol free) 25mg every 6 hours as needed °Tylenol PM °Unisom, no Gelcaps ° °Leg Cramps: °Tums °MagGel ° °Nausea/Vomiting:  °Bonine °Dramamine °Emetrol °Ginger extract °Sea bands °Meclizine  °Nausea medication to take during pregnancy:  °Unisom (doxylamine succinate 25 mg tablets) Take one tablet daily at bedtime. If symptoms are not adequately controlled, the dose can be increased to a maximum recommended   dose of two tablets daily (1/2 tablet in the morning, 1/2 tablet mid-afternoon and one at bedtime). Vitamin B6 100mg  tablets. Take one tablet twice a day (up to 200 mg per day).  Skin Rashes: Aveeno products Benadryl cream or 25mg  every 6 hours as needed Calamine Lotion 1% cortisone cream  Yeast infection: Gyne-lotrimin 7 Monistat 7   **If taking multiple medications, please check labels to avoid duplicating the same active ingredients **take medication as directed on the label ** Do not exceed 4000 mg of tylenol in 24 hours **Do not take medications that contain aspirin or ibuprofen     Prenatal Care Providers [facility]    [facility]  [phone]     Phone: [phone]          [facility]                      [facility]  @[place]     Phone: [phone]  Phone: [phone]         [facility]     Phone: [phone]  Phone: [phone]           [facility] [place]                hone: [phone]  Phone: [phone]         [facility] Francoise Ce[facility]      Phone: [phone]  Phone: [phone]         [facility]                Phone: [phone]  [facility]   [address]Phone:[phone]    [facility]([place])          Phone: [phone] [facility]   Phone: [phone]

## 2014-09-12 NOTE — MAU Provider Note (Signed)
History     CSN: [id number]  Arrival date and time: 09/12/14 1721   None     Chief Complaint  Patient presents with  . Abdominal Pain   HPI Ms. Allison Briggs is a 24 y.o. G1P0 at [redacted]w[redacted]d here being seen for BHCG.  Seen at ED on 08/25/14 for abdominal pain and irregular vaginal bleeding.  Pregnancy test was negative;  ultrasound showed a thickened endometrium 28 mm.  GC/CT neg x 2.  Pt concerned because bleeding has been irregular.  Patient's last menstrual period was 08/03/2014.  Denies pelvic pain at this time.  Spotting of blood with wiping.     Past Medical History  Diagnosis Date  . BV (bacterial vaginosis)     09/15/13  . STD (sexually transmitted disease)     h/o chlamydia, RRP  . Trichimoniasis   . Vaginal Pap smear, abnormal     Past Surgical History  Procedure Laterality Date  . Cervical biopsy  2016    Family History  Problem Relation Age of Onset  . Hypertension Maternal Grandmother   . Diabetes Maternal Grandmother   . Hypertension Paternal Grandmother   . Diabetes Other     great uncle maternal    History  Substance Use Topics  . Smoking status: Never Smoker   . Smokeless tobacco: Never Used  . Alcohol Use: No    Allergies: No Known Allergies  Prescriptions prior to admission  Medication Sig Dispense Refill Last Dose  . diphenhydrAMINE (BENADRYL) 25 MG tablet Take 25 mg by mouth every 6 (six) hours as needed for allergies.   09/11/2014 at Unknown time  . naproxen (NAPROSYN) 500 MG tablet Take 1 tablet (500 mg total) by mouth 2 (two) times daily. 30 tablet 0 Past Week at Unknown time  . cephALEXin (KEFLEX) 500 MG capsule Take 1 capsule (500 mg total) by mouth 3 (three) times daily. (Patient not taking: Reported on 08/25/2014) 30 capsule 0 Not Taking at Unknown time  . cetirizine-pseudoephedrine (ZYRTEC-D) 5-120 MG per tablet Take 1 tablet by mouth daily. (Patient not taking: Reported on 08/25/2014) 30 tablet 5 Not Taking at Unknown time  . fluconazole  (DIFLUCAN) 150 MG tablet Take 1 tablet (150 mg total) by mouth once. (Patient not taking: Reported on 09/12/2014) 1 tablet 1 Past Week at Unknown time  . fluticasone (FLONASE) 50 MCG/ACT nasal spray Place 2 sprays into both nostrils daily. (Patient not taking: Reported on 08/25/2014) 16 g 5 Unknown at Unknown time  . ibuprofen (ADVIL,MOTRIN) 800 MG tablet Take 1 tablet (800 mg total) by mouth every 8 (eight) hours as needed. (Patient not taking: Reported on 09/12/2014) 60 tablet 1 Not Taking at Unknown time  . metroNIDAZOLE (FLAGYL) 500 MG tablet Take 1 tablet (500 mg total) by mouth 2 (two) times daily. X 7 days (Patient not taking: Reported on 09/12/2014) 14 tablet 0 Past Month at Unknown time  . norgestimate-ethinyl estradiol (SPRINTEC 28) 0.25-35 MG-MCG tablet Take 1 tablet by mouth daily. (Patient not taking: Reported on 08/25/2014) 1 Package 11 Not Taking at Unknown time    ROS Physical Exam   Blood pressure 119/61, pulse 74, temperature 98.2 F (36.8 C), temperature source Oral, resp. rate 20, height  (1.753 m), weight 61.009 kg (134 lb 8 oz), last menstrual period 08/03/2014.  Physical Exam  Constitutional: She is oriented to person, place, and time. She appears well-developed and well-nourished. No distress.  HENT:  Head: Normocephalic.  Neck: Normal range of motion. Neck supple.  Cardiovascular: Normal rate, regular rhythm and normal heart sounds.   Respiratory: Effort normal and breath sounds normal. No respiratory distress.  GI: Soft. There is no tenderness.  Genitourinary: Right adnexum displays no mass. Left adnexum displays no mass. No bleeding in the vagina.  Musculoskeletal: Normal range of motion.  Neurological: She is alert and oriented to person, place, and time.  Skin: Skin is warm and dry.   Results for orders placed or performed during the hospital encounter of 09/12/14 (from the past 24 hour(s))  Urinalysis, Routine w reflex microscopic (not at [facility])     Status:  Abnormal   Collection Time: 09/12/14  5:40 PM  Result Value Ref Range   Color, Urine YELLOW YELLOW   APPearance CLEAR CLEAR   Specific Gravity, Urine 1.020 1.005 - 1.030   pH 7.5 5.0 - 8.0   Glucose, UA NEGATIVE NEGATIVE mg/dL   Hgb urine dipstick MODERATE (A) NEGATIVE   Bilirubin Urine NEGATIVE NEGATIVE   Ketones, ur NEGATIVE NEGATIVE mg/dL   Protein, ur NEGATIVE NEGATIVE mg/dL   Urobilinogen, UA 0.2 0.0 - 1.0 mg/dL   Nitrite NEGATIVE NEGATIVE   Leukocytes, UA TRACE (A) NEGATIVE  Urine microscopic-add on     Status: None   Collection Time: 09/12/14  5:40 PM  Result Value Ref Range   Squamous Epithelial / LPF RARE RARE   WBC, UA 0-2 <3 WBC/hpf   RBC / HPF 3-6 <3 RBC/hpf   Bacteria, UA RARE RARE  Pregnancy, urine POC     Status: Abnormal   Collection Time: 09/12/14  5:54 PM  Result Value Ref Range   Preg Test, Ur POSITIVE (A) NEGATIVE  hCG, quantitative, pregnancy     Status: Abnormal   Collection Time: 09/12/14  7:06 PM  Result Value Ref Range   hCG, Beta Chain, Quant, S 31029 (H) <5 mIU/mL  ABO/Rh     Status: None (Preliminary result)   Collection Time: 09/12/14  7:06 PM  Result Value Ref Range   ABO/RH(D) O POS    US Ob Comp Less 14 Wks  09/12/2014   CLINICAL DATA:  24 year old pregnant female with left lower quadrant pain and vaginal bleeding. Evaluate for ectopic pregnancy.  EXAM: OBSTETRIC <14 WK [place] AND TRANSVAGINAL OB  US DOPPLER ULTRASOUND OF OVARIES  TECHNIQUE: Both transabdominal and transvaginal ultrasound examinations were performed for complete evaluation of the gestation as well as the maternal uterus, adnexal regions, and pelvic cul-de-sac. Transvaginal technique was performed to assess early pregnancy.  Color and duplex Doppler ultrasound was utilized to evaluate blood flow to the ovaries.  COMPARISON:  None.  FINDINGS: Intrauterine gestational sac: Visualized/normal in shape.  Yolk sac:  Present  Embryo:  Present  Cardiac Activity: Detected  Heart Rate: 114 bpm   CRL:   6  mm   6 w 3 d                  [place] EDC: 05/05/2015  Maternal uterus/adnexae: The ovaries are unremarkable. Right ovarian corpus luteum noted.  Doppler images demonstrate flow to both ovaries. Small free fluid within the pelvis.  IMPRESSION: Single live intrauterine pregnancy with estimated gestational age of [redacted] weeks, 3 days and a cardiac activity of 115 beats per min.   Electronically Signed   By: Elgie Collard M.D.   On: 09/12/2014 21:27   US Ob Transvaginal  09/12/2014   CLINICAL DATA:  24 year old pregnant female with left lower quadrant pain and vaginal bleeding. Evaluate for ectopic pregnancy.  EXAM: OBSTETRIC <14 WK US AND TRANSVAGINAL OB  US DOPPLER ULTRASOUND OF OVARIES  TECHNIQUE: Both transabdominal and transvaginal ultrasound examinations were performed for complete evaluation of the gestation as well as the maternal uterus, adnexal regions, and pelvic cul-de-sac. Transvaginal technique was performed to assess early pregnancy.  Color and duplex Doppler ultrasound was utilized to evaluate blood flow to the ovaries.  COMPARISON:  None.  FINDINGS: Intrauterine gestational sac: Visualized/normal in shape.  Yolk sac:  Present  Embryo:  Present  Cardiac Activity: Detected  Heart Rate: 114 bpm  CRL:   6  mm   6 w 3 d                  US EDC: 05/05/2015  Maternal uterus/adnexae: The ovaries are unremarkable. Right ovarian corpus luteum noted.  Doppler images demonstrate flow to both ovaries. Small free fluid within the pelvis.  IMPRESSION: Single live intrauterine pregnancy with estimated gestational age of [redacted] weeks, 3 days and a cardiac activity of 115 beats per min.   Electronically Signed   By: Elgie CollardArash  Radparvar M.D.   On: 09/12/2014 21:27    MAU Course  Procedures  1928 Report given to H. Mathews RobinsonsHogan who assumes care of patient. Eino FarberWalidah Kennith GainN Karim, CNM   Assessment and Plan   1. Abdominal pain in pregnancy, antepartum   2. Vaginal bleeding in pregnancy, first trimester    DC home Comfort  measures reviewed  1st Trimester precautions  Bleeding precautions RX: none  Return to MAU as needed FU with OB as planned  Follow-up Information    Schedule an appointment as soon as possible [facility]   Specialty:  Obstetrics and Gynecology   Contact information:   [address] [place] [id number] [phone]

## 2014-09-12 NOTE — MAU Note (Signed)
Patient presents stating that the health dept informed her today that she had a possible ectopic pregnancy. C/o left abdominal pain and bleeding after sex. States the provider at the health dept stated she saw tissue outside her cervix during an exam.

## 2014-09-13 LAB — ABO/RH: ABO/RH(D): O POS

## 2014-10-01 ENCOUNTER — Encounter (HOSPITAL_COMMUNITY): Payer: Self-pay | Admitting: *Deleted

## 2014-10-01 ENCOUNTER — Inpatient Hospital Stay (HOSPITAL_COMMUNITY)
Admission: AD | Admit: 2014-10-01 | Discharge: 2014-10-01 | Disposition: A | Discharge status: Home / Self Care | Payer: Self-pay | Source: Ambulatory Visit | Attending: Obstetrics & Gynecology | Admitting: Obstetrics & Gynecology

## 2014-10-01 ENCOUNTER — Inpatient Hospital Stay (HOSPITAL_COMMUNITY): Payer: Self-pay

## 2014-10-01 DIAGNOSIS — Z3A08 8 weeks gestation of pregnancy: Secondary | ICD-10-CM | POA: Insufficient documentation

## 2014-10-01 DIAGNOSIS — O209 Hemorrhage in early pregnancy, unspecified: Secondary | ICD-10-CM | POA: Insufficient documentation

## 2014-10-01 DIAGNOSIS — O26899 Other specified pregnancy related conditions, unspecified trimester: Secondary | ICD-10-CM

## 2014-10-01 DIAGNOSIS — O4691 Antepartum hemorrhage, unspecified, first trimester: Secondary | ICD-10-CM

## 2014-10-01 DIAGNOSIS — Z3491 Encounter for supervision of normal pregnancy, unspecified, first trimester: Secondary | ICD-10-CM

## 2014-10-01 DIAGNOSIS — R109 Unspecified abdominal pain: Secondary | ICD-10-CM | POA: Insufficient documentation

## 2014-10-01 LAB — CBC
HCT: 32.1 % — ABNORMAL LOW (ref 36.0–46.0)
Hemoglobin: 10.5 g/dL — ABNORMAL LOW (ref 12.0–15.0)
MCH: 26.4 pg (ref 26.0–34.0)
MCHC: 32.7 g/dL (ref 30.0–36.0)
MCV: 80.7 fL (ref 78.0–100.0)
Platelets: 227 10*3/uL (ref 150–400)
RBC: 3.98 MIL/uL (ref 3.87–5.11)
RDW: 19 % — ABNORMAL HIGH (ref 11.5–15.5)
WBC: 9.5 10*3/uL (ref 4.0–10.5)

## 2014-10-01 LAB — HCG, QUANTITATIVE, PREGNANCY: HCG, BETA CHAIN, QUANT, S: 178540 m[IU]/mL — AB (ref <5)

## 2014-10-01 LAB — WET PREP, GENITAL
Clue Cells Wet Prep HPF POC: NONE SEEN
Trich, Wet Prep: NONE SEEN
Yeast Wet Prep HPF POC: NONE SEEN

## 2014-10-01 LAB — URINALYSIS, ROUTINE W REFLEX MICROSCOPIC
Bilirubin Urine: NEGATIVE
Glucose, UA: NEGATIVE mg/dL
Ketones, ur: NEGATIVE mg/dL
NITRITE: NEGATIVE
PH: 6 (ref 5.0–8.0)
PROTEIN: NEGATIVE mg/dL
Urobilinogen, UA: 0.2 mg/dL (ref 0.0–1.0)

## 2014-10-01 LAB — URINE MICROSCOPIC-ADD ON

## 2014-10-01 NOTE — Discharge Instructions (Signed)
Vaginal Bleeding During Pregnancy, First Trimester  A small amount of bleeding (spotting) from the vagina is relatively common in early pregnancy. It usually stops on its own. Various things may cause bleeding or spotting in early pregnancy. Some bleeding may be related to the pregnancy, and some may not. In most cases, the bleeding is normal and is not a problem. However, bleeding can also be a sign of something serious. Be sure to tell your health care provider about any vaginal bleeding right away.  Some possible causes of vaginal bleeding during the first trimester include:  · Infection or inflammation of the cervix.  · Growths (polyps) on the cervix.  · Miscarriage or threatened miscarriage.  · Pregnancy tissue has developed outside of the uterus and in a fallopian tube (tubal pregnancy).  · Tiny cysts have developed in the uterus instead of pregnancy tissue (molar pregnancy).  HOME CARE INSTRUCTIONS   Watch your condition for any changes. The following actions may help to lessen any discomfort you are feeling:  · Follow your health care provider's instructions for limiting your activity. If your health care provider orders bed rest, you may need to stay in bed and only get up to use the bathroom. However, your health care provider may allow you to continue light activity.  · If needed, make plans for someone to help with your regular activities and responsibilities while you are on bed rest.  · Keep track of the number of pads you use each day, how often you change pads, and how soaked (saturated) they are. Write this down.  · Do not use tampons. Do not douche.  · Do not have sexual intercourse or orgasms until approved by your health care provider.  · If you pass any tissue from your vagina, save the tissue so you can show it to your health care provider.  · Only take over-the-counter or prescription medicines as directed by your health care provider.  · Do not take aspirin because it can make you  bleed.  · Keep all follow-up appointments as directed by your health care provider.  SEEK MEDICAL CARE IF:  · You have any vaginal bleeding during any part of your pregnancy.  · You have cramps or labor pains.  · You have a fever, not controlled by medicine.  SEEK IMMEDIATE MEDICAL CARE IF:   · You have severe cramps in your back or belly (abdomen).  · You pass large clots or tissue from your vagina.  · Your bleeding increases.  · You feel light-headed or weak, or you have fainting episodes.  · You have chills.  · You are leaking fluid or have a gush of fluid from your vagina.  · You pass out while having a bowel movement.  MAKE SURE YOU:  · Understand these instructions.  · Will watch your condition.  · Will get help right away if you are not doing well or get worse.  Document Released: 12/10/2004 Document Revised: 03/07/2013 Document Reviewed: 11/07/2012  ExitCare® Patient Information ©2015 [employer]. This information is not intended to replace advice given to you by your health care provider. Make sure you discuss any questions you have with your health care provider.

## 2014-10-01 NOTE — MAU Provider Note (Signed)
Chief Complaint: Vaginal Bleeding   None     SUBJECTIVE HPI: Allison Briggs is a 24 y.o. G1P0 at [redacted]w[redacted]d by LMP who presents to maternity admissions reporting onset of abdominal pain last night described as intermittent, cramping, and severe.  She reports onset of bleeding starting as one large clot and followed by light period bleeding this morning.  Cramping continues but is lighter at this time.  Pt plans termination of pregnancy in 2 weeks.  She denies vaginal bleeding, vaginal itching/burning, urinary symptoms, h/a, dizziness, n/v, or fever/chills.     Abdominal Pain This is a new problem. The current episode started yesterday. The onset quality is gradual. The problem occurs intermittently. The most recent episode lasted 1 day. The problem has been waxing and waning. The pain is located in the LLQ and RUQ. The pain is moderate. The quality of the pain is cramping. The abdominal pain does not radiate. Pertinent negatives include no constipation, dysuria, fever, frequency, nausea or vomiting. She has tried nothing for the symptoms.  Vaginal Bleeding The patient's primary symptoms include vaginal bleeding. This is a new problem. The current episode started yesterday. The problem occurs constantly. The problem has been gradually improving. The pain is moderate. She is pregnant. Associated symptoms include abdominal pain. Pertinent negatives include no constipation, dysuria, fever, frequency, nausea or vomiting. The vaginal bleeding is lighter than menses. She has been passing clots. She has not been passing tissue. She has tried nothing for the symptoms. She is sexually active. No, her partner does not have an STD.    Past Medical History  Diagnosis Date  . BV (bacterial vaginosis)     09/15/13  . STD (sexually transmitted disease)     h/o chlamydia, RRP  . Trichimoniasis   . Vaginal Pap smear, abnormal    Past Surgical History  Procedure Laterality Date  . Cervical biopsy  2016   History    Social History  . Marital Status: Single    Spouse Name: N/A  . Number of Children: N/A  . Years of Education: N/A   Occupational History  . Not on file.   Social History Main Topics  . Smoking status: Never Smoker   . Smokeless tobacco: Never Used  . Alcohol Use: No  . Drug Use: Yes    Special: Marijuana  . Sexual Activity: Yes    Birth Control/ Protection: Pill   Other Topics Concern  . Not on file   Social History Narrative   No current facility-administered medications on file prior to encounter.   Current Outpatient Prescriptions on File Prior to Encounter  Medication Sig Dispense Refill  . diphenhydrAMINE (BENADRYL) 25 MG tablet Take 25 mg by mouth every 6 (six) hours as needed for allergies.     No Known Allergies  Review of Systems  Constitutional: Negative for fever.  Gastrointestinal: Positive for abdominal pain. Negative for nausea, vomiting and constipation.  Genitourinary: Positive for vaginal bleeding. Negative for dysuria and frequency.       Vaginal bleeding    OBJECTIVE Blood pressure 117/74, pulse 89, temperature 98.4 F (36.9 C), temperature source Oral, resp. rate 16, height 5' 8.25" (1.734 m), weight 59.421 kg (131 lb), last menstrual period 08/03/2014. GENERAL: Well-developed, well-nourished female in no acute distress.  EYES: normal sclera/conjunctiva; no lid-lag HENT: Atraumatic, normocephalic HEART: normal rate RESP: normal effort ABDOMEN: Soft, non-tender MUSCULOSKELETAL: Normal ROM EXTREMITIES: Nontender, no edema NEURO/PSYCH: Alert and oriented, appropriate affect  PELVIC EXAM: Cervix pink, visually closed, without lesion,  small amount brown discharge and scant bright red bleeding at cervical os, vaginal walls and external genitalia normal Bimanual exam: Cervix 0/long/high, firm, anterior, neg CMT, uterus nontender, ~ 9 week size, adnexa without tenderness, enlargement, or mass   LAB RESULTS Results for orders placed or performed  during the hospital encounter of 10/01/14 (from the past 24 hour(s))  Urinalysis, Routine w reflex microscopic (not at [facility])     Status: Abnormal   Collection Time: 10/01/14  1:30 PM  Result Value Ref Range   Color, Urine YELLOW YELLOW   APPearance HAZY (A) CLEAR   Specific Gravity, Urine >1.030 (H) 1.005 - 1.030   pH 6.0 5.0 - 8.0   Glucose, UA NEGATIVE NEGATIVE mg/dL   Hgb urine dipstick LARGE (A) NEGATIVE   Bilirubin Urine NEGATIVE NEGATIVE   Ketones, ur NEGATIVE NEGATIVE mg/dL   Protein, ur NEGATIVE NEGATIVE mg/dL   Urobilinogen, UA 0.2 0.0 - 1.0 mg/dL   Nitrite NEGATIVE NEGATIVE   Leukocytes, UA TRACE (A) NEGATIVE  Urine microscopic-add on     Status: Abnormal   Collection Time: 10/01/14  1:30 PM  Result Value Ref Range   Squamous Epithelial / LPF FEW (A) RARE   WBC, UA 3-6 <3 WBC/hpf   RBC / HPF 0-2 <3 RBC/hpf   Urine-Other MUCOUS PRESENT   CBC     Status: Abnormal   Collection Time: 10/01/14  3:07 PM  Result Value Ref Range   WBC 9.5 4.0 - 10.5 K/uL   RBC 3.98 3.87 - 5.11 MIL/uL   Hemoglobin 10.5 (L) 12.0 - 15.0 g/dL   HCT 40.932.1 (L) 81.136.0 - 91.446.0 %   MCV 80.7 78.0 - 100.0 fL   MCH 26.4 26.0 - 34.0 pg   MCHC 32.7 30.0 - 36.0 g/dL   RDW 78.219.0 (H) 95.611.5 - 21.315.5 %   Platelets 227 150 - 400 K/uL  hCG, quantitative, pregnancy     Status: Abnormal   Collection Time: 10/01/14  3:07 PM  Result Value Ref Range   hCG, Beta Chain, Quant, S [id number] (H) <5 mIU/mL  Wet prep, genital     Status: Abnormal   Collection Time: 10/01/14  5:08 PM  Result Value Ref Range   Yeast Wet Prep HPF POC NONE SEEN NONE SEEN   Trich, Wet Prep NONE SEEN NONE SEEN   Clue Cells Wet Prep HPF POC NONE SEEN NONE SEEN   WBC, Wet Prep HPF POC MODERATE (A) NONE SEEN   O POS  IMAGING [place] Ob Comp Less 14 Wks  09/12/2014   CLINICAL DATA:  24 year old pregnant female with left lower quadrant pain and vaginal bleeding. Evaluate for ectopic pregnancy.  EXAM: OBSTETRIC <14 WK US AND TRANSVAGINAL OB  US DOPPLER  ULTRASOUND OF OVARIES  TECHNIQUE: Both transabdominal and transvaginal ultrasound examinations were performed for complete evaluation of the gestation as well as the maternal uterus, adnexal regions, and pelvic cul-de-sac. Transvaginal technique was performed to assess early pregnancy.  Color and duplex Doppler ultrasound was utilized to evaluate blood flow to the ovaries.  COMPARISON:  None.  FINDINGS: Intrauterine gestational sac: Visualized/normal in shape.  Yolk sac:  Present  Embryo:  Present  Cardiac Activity: Detected  Heart Rate: 114 bpm  CRL:   6  mm   6 w 3 d                  US EDC: 05/05/2015  Maternal uterus/adnexae: The ovaries are unremarkable. Right ovarian corpus luteum noted.  Doppler images  demonstrate flow to both ovaries. Small free fluid within the pelvis.  IMPRESSION: Single live intrauterine pregnancy with estimated gestational age of [redacted] weeks, 3 days and a cardiac activity of 115 beats per min.   Electronically Signed   By: Elgie Collard M.D.   On: 09/12/2014 21:27   US Ob Transvaginal  10/01/2014   CLINICAL DATA:  Pelvic/abdominal cramping. Past blood clot per vagina last evening. Patient 8 weeks and 3 days pregnant based on her last menstrual period.  EXAM: TRANSVAGINAL OB ULTRASOUND  TECHNIQUE: Transvaginal ultrasound was performed for complete evaluation of the gestation as well as the maternal uterus, adnexal regions, and pelvic cul-de-sac.  COMPARISON:  09/12/2014  FINDINGS: Intrauterine gestational sac: Visualized/normal in shape.  Yolk sac:  Yes  Embryo:  Yes  Cardiac Activity: Yes  Heart Rate: 172 bpm  CRL:   23.7  mm   9 w 1 d                  [place] EDC: 05/05/2015  Maternal uterus/adnexae: No uterine masses. No subchorionic hemorrhage. Cervix is closed. Ovaries unremarkable. Corpus luteum arises from the right ovary. No pelvic free fluid. No adnexal masses.  IMPRESSION: Single live intrauterine pregnancy with a measured gestational age of [redacted] weeks 1 day, showing normal interval  growth from the recent prior obstetrical ultrasound. No emergent complication.   Electronically Signed   By: Amie Portland M.D.   On: 10/01/2014 16:31   US Ob Transvaginal  09/12/2014   CLINICAL DATA:  24 year old pregnant female with left lower quadrant pain and vaginal bleeding. Evaluate for ectopic pregnancy.  EXAM: OBSTETRIC <14 WK [place] AND TRANSVAGINAL OB  US DOPPLER ULTRASOUND OF OVARIES  TECHNIQUE: Both transabdominal and transvaginal ultrasound examinations were performed for complete evaluation of the gestation as well as the maternal uterus, adnexal regions, and pelvic cul-de-sac. Transvaginal technique was performed to assess early pregnancy.  Color and duplex Doppler ultrasound was utilized to evaluate blood flow to the ovaries.  COMPARISON:  None.  FINDINGS: Intrauterine gestational sac: Visualized/normal in shape.  Yolk sac:  Present  Embryo:  Present  Cardiac Activity: Detected  Heart Rate: 114 bpm  CRL:   6  mm   6 w 3 d                  [place] EDC: 05/05/2015  Maternal uterus/adnexae: The ovaries are unremarkable. Right ovarian corpus luteum noted.  Doppler images demonstrate flow to both ovaries. Small free fluid within the pelvis.  IMPRESSION: Single live intrauterine pregnancy with estimated gestational age of [redacted] weeks, 3 days and a cardiac activity of 115 beats per min.   Electronically Signed   By: Elgie Collard M.D.   On: 09/12/2014 21:27   ED Management Ordered labs and ultrasound, reviewed results.    ASSESSMENT 1. Normal IUP (intrauterine pregnancy) on prenatal ultrasound, first trimester   2. Vaginal bleeding in pregnancy, first trimester   3. Abdominal pain affecting pregnancy    PLAN Discharge home with bleeding precautions F/U with prenatal care or with termination as scheduled    Medication List    TAKE these medications        diphenhydrAMINE 25 MG tablet  Commonly known as:  BENADRYL  Take 25 mg by mouth every 6 (six) hours as needed for allergies.     prenatal  multivitamin Tabs tablet  Take 1 tablet by mouth daily at 12 noon.     terconazole 0.4 % vaginal cream  Commonly known as:  TERAZOL 7  Place 1 applicator vaginally at bedtime.       Follow-up Information    Please follow up.   Why:  With provider of your choice      Follow up with [facility] [place] [facility] ADMISS[facility].   Why:  As needed for emergencies   Contact information:   [address] [id number] mc [place] [address] [phone]      Sharen Counter Certified Nurse-Midwife 10/01/2014  5:32 PM

## 2014-10-01 NOTE — MAU Note (Signed)
last night was having real bad cramps, had been mild last week, became severe last night, not as bad now.  Passed a clot- about the size of a gatorade bottle cap.  This morning was having bleeding, kind of like a period.

## 2014-10-02 LAB — GC/CHLAMYDIA PROBE AMP (~~LOC~~) NOT AT ARMC
CHLAMYDIA, DNA PROBE: NEGATIVE
Neisseria Gonorrhea: NEGATIVE

## 2014-10-08 ENCOUNTER — Inpatient Hospital Stay (HOSPITAL_COMMUNITY)
Admission: AD | Admit: 2014-10-08 | Discharge: 2014-10-08 | Disposition: A | Discharge status: Home / Self Care | Payer: Self-pay | Source: Ambulatory Visit | Attending: Obstetrics and Gynecology | Admitting: Obstetrics and Gynecology

## 2014-10-08 ENCOUNTER — Encounter (HOSPITAL_COMMUNITY): Payer: Self-pay | Admitting: *Deleted

## 2014-10-08 ENCOUNTER — Inpatient Hospital Stay (HOSPITAL_COMMUNITY): Payer: Self-pay

## 2014-10-08 DIAGNOSIS — O209 Hemorrhage in early pregnancy, unspecified: Secondary | ICD-10-CM

## 2014-10-08 DIAGNOSIS — O039 Complete or unspecified spontaneous abortion without complication: Secondary | ICD-10-CM | POA: Insufficient documentation

## 2014-10-08 DIAGNOSIS — D509 Iron deficiency anemia, unspecified: Secondary | ICD-10-CM

## 2014-10-08 HISTORY — DX: Anemia, unspecified: D64.9

## 2014-10-08 LAB — CBC
HEMATOCRIT: 28.4 % — AB (ref 36.0–46.0)
Hemoglobin: 9.3 g/dL — ABNORMAL LOW (ref 12.0–15.0)
MCH: 26.6 pg (ref 26.0–34.0)
MCHC: 32.7 g/dL (ref 30.0–36.0)
MCV: 81.1 fL (ref 78.0–100.0)
Platelets: 254 10*3/uL (ref 150–400)
RBC: 3.5 MIL/uL — ABNORMAL LOW (ref 3.87–5.11)
RDW: 18.6 % — AB (ref 11.5–15.5)
WBC: 13.5 10*3/uL — ABNORMAL HIGH (ref 4.0–10.5)

## 2014-10-08 LAB — URINE MICROSCOPIC-ADD ON

## 2014-10-08 LAB — URINALYSIS, ROUTINE W REFLEX MICROSCOPIC
BILIRUBIN URINE: NEGATIVE
Glucose, UA: NEGATIVE mg/dL
KETONES UR: NEGATIVE mg/dL
LEUKOCYTES UA: NEGATIVE
Nitrite: NEGATIVE
PH: 6 (ref 5.0–8.0)
PROTEIN: 100 mg/dL — AB
Urobilinogen, UA: 1 mg/dL (ref 0.0–1.0)

## 2014-10-08 MED ORDER — PROMETHAZINE HCL 25 MG PO TABS: 12.5000 mg | ORAL_TABLET | Freq: Four times a day (QID) | ORAL | Status: DC | PRN

## 2014-10-08 MED ORDER — IBUPROFEN 600 MG PO TABS
600.0000 mg | ORAL_TABLET | Freq: Once | ORAL | Status: AC
  Administered 2014-10-08: 600 mg via ORAL
  Filled 2014-10-08: qty 1

## 2014-10-08 MED ORDER — IBUPROFEN 600 MG PO TABS: 600.0000 mg | ORAL_TABLET | Freq: Four times a day (QID) | ORAL | Status: DC | PRN

## 2014-10-08 MED ORDER — FERROUS SULFATE 325 (65 FE) MG PO TABS: 325.0000 mg | ORAL_TABLET | Freq: Two times a day (BID) | ORAL | Status: DC

## 2014-10-08 NOTE — MAU Note (Signed)
Pt reports she is 9 weeks preg and started cramping last pm, now pain is a lot worse and she began bleeding this am. Also reports passing large clots.

## 2014-10-08 NOTE — Discharge Instructions (Signed)
Miscarriage A miscarriage is the sudden loss of an unborn baby (fetus) before the 20th week of pregnancy. Most miscarriages happen in the first 3 months of pregnancy. Sometimes, it happens before a woman even knows she is pregnant. A miscarriage is also called a "spontaneous miscarriage" or "early pregnancy loss." Having a miscarriage can be an emotional experience. Talk with your caregiver about any questions you may have about miscarrying, the grieving process, and your future pregnancy plans. CAUSES   Problems with the fetal chromosomes that make it impossible for the baby to develop normally. Problems with the baby's genes or chromosomes are most often the result of errors that occur, by chance, as the embryo divides and grows. The problems are not inherited from the parents.  Infection of the cervix or uterus.   Hormone problems.   Problems with the cervix, such as having an incompetent cervix. This is when the tissue in the cervix is not strong enough to hold the pregnancy.   Problems with the uterus, such as an abnormally shaped uterus, uterine fibroids, or congenital abnormalities.   Certain medical conditions.   Smoking, drinking alcohol, or taking illegal drugs.   Trauma.  Often, the cause of a miscarriage is unknown.  SYMPTOMS   Vaginal bleeding or spotting, with or without cramps or pain.  Pain or cramping in the abdomen or lower back.  Passing fluid, tissue, or blood clots from the vagina. DIAGNOSIS  Your caregiver will perform a physical exam. You may also have an ultrasound to confirm the miscarriage. Blood or urine tests may also be ordered. TREATMENT   Sometimes, treatment is not necessary if you naturally pass all the fetal tissue that was in the uterus. If some of the fetus or placenta remains in the body (incomplete miscarriage), tissue left behind may become infected and must be removed. Usually, a dilation and curettage (D and C) procedure is performed.  During a D and C procedure, the cervix is widened (dilated) and any remaining fetal or placental tissue is gently removed from the uterus.  Antibiotic medicines are prescribed if there is an infection. Other medicines may be given to reduce the size of the uterus (contract) if there is a lot of bleeding.  If you have Rh negative blood and your baby was Rh positive, you will need a Rh immunoglobulin shot. This shot will protect any future baby from having Rh blood problems in future pregnancies. HOME CARE INSTRUCTIONS   Your caregiver may order bed rest or may allow you to continue light activity. Resume activity as directed by your caregiver.  Have someone help with home and family responsibilities during this time.   Keep track of the number of sanitary pads you use each day and how soaked (saturated) they are. Write down this information.   Do not use tampons. Do not douche or have sexual intercourse until approved by your caregiver.   Only take over-the-counter or prescription medicines for pain or discomfort as directed by your caregiver.   Do not take aspirin. Aspirin can cause bleeding.   Keep all follow-up appointments with your caregiver.   If you or your partner have problems with grieving, talk to your caregiver or seek counseling to help cope with the pregnancy loss. Allow enough time to grieve before trying to get pregnant again.  SEEK IMMEDIATE MEDICAL CARE IF:   You have severe cramps or pain in your back or abdomen.  You have a fever.  You pass large blood clots (walnut-sized   or larger) ortissue from your vagina. Save any tissue for your caregiver to inspect.   Your bleeding increases.   You have a thick, bad-smelling vaginal discharge.  You become lightheaded, weak, or you faint.   You have chills.  MAKE SURE YOU:  Understand these instructions.  Will watch your condition.  Will get help right away if you are not doing well or get  worse. Document Released: 08/26/2000 Document Revised: 06/27/2012 Document Reviewed: 04/21/2011 ExitCare Patient Information 2015 [employer]. This information is not intended to replace advice given to you by your health care provider. Make sure you discuss any questions you have with your health care provider.  

## 2014-10-08 NOTE — MAU Provider Note (Signed)
History     CSN: [id number]  Arrival date and time: 10/08/14 0865   First Provider Initiated Contact with Patient 10/08/14 [phone]      No chief complaint on file.  HPI  Allison Briggs is a 24 y.o. G1P0 at [redacted]w[redacted]d here with report of passing blood clots starting at 0400 this morning.  Also reports lower pelvic pain rated a 10/10 that passes in waves.  Previously diagnosed with an intrauterine pregnancy.    Past Medical History  Diagnosis Date  . BV (bacterial vaginosis)     09/15/13  . STD (sexually transmitted disease)     h/o chlamydia, RRP  . Trichimoniasis   . Vaginal Pap smear, abnormal   . Anemia     Past Surgical History  Procedure Laterality Date  . Cervical biopsy  2016    Family History  Problem Relation Age of Onset  . Hypertension Maternal Grandmother   . Diabetes Maternal Grandmother   . Hypertension Paternal Grandmother   . Diabetes Other     great uncle maternal    History  Substance Use Topics  . Smoking status: Never Smoker   . Smokeless tobacco: Never Used  . Alcohol Use: No    Allergies: No Known Allergies  Prescriptions prior to admission  Medication Sig Dispense Refill Last Dose  . diphenhydrAMINE (BENADRYL) 25 MG tablet Take 25 mg by mouth every 6 (six) hours as needed for allergies.   09/30/2014 at Unknown time  . Prenatal Vit-Fe Fumarate-FA (PRENATAL MULTIVITAMIN) TABS tablet Take 1 tablet by mouth daily at 12 noon.   09/30/2014 at Unknown time  . terconazole (TERAZOL 7) 0.4 % vaginal cream Place 1 applicator vaginally at bedtime.   Past Week at Unknown time    Review of Systems  Constitutional: Negative for fever, chills and malaise/fatigue.  Gastrointestinal: Positive for abdominal pain and constipation. Negative for nausea and vomiting.  Genitourinary: Negative for dysuria and urgency.  Neurological: Negative for dizziness.  All other systems reviewed and are negative.  Physical Exam   Blood pressure 122/53, pulse 64, temperature  98.6 F (37 C), temperature source Oral, resp. rate 18, height 5' 8.25" (1.734 m), weight 132 lb (59.875 kg), last menstrual period 08/03/2014, SpO2 100 %.  Physical Exam  Constitutional: She is oriented to person, place, and time. She appears well-developed and well-nourished. No distress.  Uncomfortable appearing  HENT:  Head: Normocephalic.  Neck: Normal range of motion. Neck supple.  Cardiovascular: Normal rate, regular rhythm and normal heart sounds.  Exam reveals no gallop and no friction rub.   No murmur heard. Respiratory: Effort normal and breath sounds normal. No respiratory distress.  GI: Soft. She exhibits no mass. There is tenderness (suprapubic). There is no rebound and no guarding.  Genitourinary: There is bleeding (moderate; +clots; teased out tissue that appeared to be products of conception) in the vagina.  Neurological: She is alert and oriented to person, place, and time.  Skin: Skin is warm and dry.    MAU Course  Procedures 0745 Ibuprofen 600 mg PO Obtain CBC and ultrasound  0810 report given to Sharen Counter who assumes care of patient. Marlis Edelson, CNM  Assessment and Plan   Results for orders placed or performed during the hospital encounter of 10/08/14 (from the past 24 hour(s))  Urinalysis, Routine w reflex microscopic (not at [facility])     Status: Abnormal   Collection Time: 10/08/14  7:17 AM  Result Value Ref Range   Color, Urine YELLOW  YELLOW   APPearance CLOUDY (A) CLEAR   Specific Gravity, Urine >1.030 (H) 1.005 - 1.030   pH 6.0 5.0 - 8.0   Glucose, UA NEGATIVE NEGATIVE mg/dL   Hgb urine dipstick LARGE (A) NEGATIVE   Bilirubin Urine NEGATIVE NEGATIVE   Ketones, ur NEGATIVE NEGATIVE mg/dL   Protein, ur 161 (A) NEGATIVE mg/dL   Urobilinogen, UA 1.0 0.0 - 1.0 mg/dL   Nitrite NEGATIVE NEGATIVE   Leukocytes, UA NEGATIVE NEGATIVE  Urine microscopic-add on     Status: Abnormal   Collection Time: 10/08/14  7:17 AM  Result Value Ref Range    Squamous Epithelial / LPF FEW (A) RARE   WBC, UA 0-2 <3 WBC/hpf   RBC / HPF TOO NUMEROUS TO COUNT <3 RBC/hpf   Bacteria, UA RARE RARE  CBC     Status: Abnormal   Collection Time: 10/08/14  7:55 AM  Result Value Ref Range   WBC 13.5 (H) 4.0 - 10.5 K/uL   RBC 3.50 (L) 3.87 - 5.11 MIL/uL   Hemoglobin 9.3 (L) 12.0 - 15.0 g/dL   HCT 09.6 (L) 04.5 - 40.9 %   MCV 81.1 78.0 - 100.0 fL   MCH 26.6 26.0 - 34.0 pg   MCHC 32.7 30.0 - 36.0 g/dL   RDW 81.1 (H) 91.4 - 78.2 %   Platelets 254 150 - 400 K/uL   O POS   US Ob Transvaginal  10/08/2014   CLINICAL DATA:  Pregnant, heavy vaginal bleeding (passing clots), lower pelvic pain  EXAM: TRANSVAGINAL OB ULTRASOUND  TECHNIQUE: Transvaginal ultrasound was performed for complete evaluation of the gestation as well as the maternal uterus, adnexal regions, and pelvic cul-de-sac.  COMPARISON:  10/01/2014  FINDINGS: Intrauterine gestational sac: Not visualized  Yolk sac:  Not visualized  Embryo:  Not visualized  Maternal uterus/adnexae: Thickened/heterogeneous endometrium, measuring 25 mm, with associated blood products/debris within the endometrial cavity.  Bilateral ovaries are within normal limits.  No free fluid.  IMPRESSION: No IUP is visualized.  Thickened/heterogeneous endometrium with associated blood products/debris within the endometrial cavity.  Given the clinical scenario, this appearance likely reflects abortion in progress.   Electronically Signed   By: Charline Bills M.D.   On: 10/08/2014 08:40    A: 1. Vaginal bleeding in pregnancy, first trimester   2. SAB (spontaneous abortion)     P:  D/C home with bleeding precautions Ibuprofen 600 mg, Phenergan 25 mg, and ferrous sulfate Rx sent to pharmacy F/U in [facility] in 2 weeks for repeat quant hcg  LEFTWICH-KIRBY, Milania Haubner, CNM 10:12 AM

## 2014-10-11 ENCOUNTER — Inpatient Hospital Stay (HOSPITAL_COMMUNITY)
Admission: AD | Admit: 2014-10-11 | Discharge: 2014-10-11 | Disposition: A | Discharge status: Home / Self Care | Payer: Self-pay | Source: Ambulatory Visit | Attending: Family Medicine | Admitting: Family Medicine

## 2014-10-11 ENCOUNTER — Encounter (HOSPITAL_COMMUNITY): Payer: Self-pay

## 2014-10-11 DIAGNOSIS — D649 Anemia, unspecified: Secondary | ICD-10-CM | POA: Insufficient documentation

## 2014-10-11 DIAGNOSIS — O039 Complete or unspecified spontaneous abortion without complication: Secondary | ICD-10-CM | POA: Insufficient documentation

## 2014-10-11 LAB — CBC
HCT: 26.8 % — ABNORMAL LOW (ref 36.0–46.0)
HEMOGLOBIN: 8.5 g/dL — AB (ref 12.0–15.0)
MCH: 26.3 pg (ref 26.0–34.0)
MCHC: 31.7 g/dL (ref 30.0–36.0)
MCV: 83 fL (ref 78.0–100.0)
PLATELETS: 274 10*3/uL (ref 150–400)
RBC: 3.23 MIL/uL — ABNORMAL LOW (ref 3.87–5.11)
RDW: 19.1 % — ABNORMAL HIGH (ref 11.5–15.5)
WBC: 12.4 10*3/uL — AB (ref 4.0–10.5)

## 2014-10-11 MED ORDER — KETOROLAC TROMETHAMINE 60 MG/2ML IM SOLN
60.0000 mg | Freq: Once | INTRAMUSCULAR | Status: AC
  Administered 2014-10-11: 60 mg via INTRAMUSCULAR
  Filled 2014-10-11: qty 2

## 2014-10-11 MED ORDER — ONDANSETRON 8 MG PO TBDP
8.0000 mg | ORAL_TABLET | Freq: Once | ORAL | Status: AC
  Administered 2014-10-11: 8 mg via ORAL
  Filled 2014-10-11: qty 1

## 2014-10-11 MED ORDER — HYDROMORPHONE HCL 2 MG/ML IJ SOLN
2.0000 mg | Freq: Once | INTRAMUSCULAR | Status: AC
  Administered 2014-10-11: 2 mg via INTRAMUSCULAR
  Filled 2014-10-11: qty 1

## 2014-10-11 MED ORDER — ACETAMINOPHEN-CODEINE 300-30 MG PO TABS: 1.0000 | ORAL_TABLET | ORAL | Status: DC | PRN

## 2014-10-11 NOTE — MAU Note (Signed)
Pt presents complaining of severe abdominal pain and vaginal bleeding. Was told she was having a miscarriage on the 25th and chose expectant management. Bleeding is the same but pain is worse.

## 2014-10-11 NOTE — MAU Provider Note (Signed)
History     CSN: [id number]  Arrival date and time: 10/11/14 1730   First Provider Initiated Contact with Patient 10/11/14 1753      Chief Complaint  Patient presents with  . Abdominal Pain   HPI  Ms. Allison Briggs is a 24 y.o. G1P0 here status post miscarriage.  Upon review of the records patient was seen on 10/08/14 for increased vaginal bleeding.  Ultrasound showed a miscarriage in progress.  Previously diagnosed with an IUP.  Pathology confirmed products of conception.  Pt here with report of increased pelvic pain not resolved with ibuprofen and nausea and vomiting.  Decrease in vaginal bleeding reported since discharge on 10/09/14.    Past Medical History  Diagnosis Date  . BV (bacterial vaginosis)     09/15/13  . STD (sexually transmitted disease)     h/o chlamydia, RRP  . Trichimoniasis   . Vaginal Pap smear, abnormal   . Anemia     Past Surgical History  Procedure Laterality Date  . Cervical biopsy  2016    Family History  Problem Relation Age of Onset  . Hypertension Maternal Grandmother   . Diabetes Maternal Grandmother   . Hypertension Paternal Grandmother   . Diabetes Other     great uncle maternal    History  Substance Use Topics  . Smoking status: Never Smoker   . Smokeless tobacco: Never Used  . Alcohol Use: No    Allergies:  Allergies  Allergen Reactions  . Latex Other (See Comments)    Causes irritation.    Prescriptions prior to admission  Medication Sig Dispense Refill Last Dose  . ferrous sulfate (FERROUSUL) 325 (65 FE) MG tablet Take 1 tablet (325 mg total) by mouth 2 (two) times daily. 60 tablet 1 10/10/2014 at Unknown time  . ibuprofen (ADVIL,MOTRIN) 600 MG tablet Take 1 tablet (600 mg total) by mouth every 6 (six) hours as needed. 30 tablet 0 10/11/2014 at 1100  . Prenatal Vit-Fe Fumarate-FA (PRENATAL MULTIVITAMIN) TABS tablet Take 1 tablet by mouth daily at 12 noon.   Past Month at Unknown time  . promethazine (PHENERGAN) 25 MG tablet  Take 0.5-1 tablets (12.5-25 mg total) by mouth every 6 (six) hours as needed. (Patient not taking: Reported on 10/11/2014) 30 tablet 0 Not Taking at Unknown time    Review of Systems  Gastrointestinal: Positive for nausea, vomiting and abdominal pain.  Genitourinary: Negative for dysuria, urgency and frequency.       Vaginal bleeding  All other systems reviewed and are negative.  Physical Exam   Blood pressure 128/62, pulse 61, temperature 98.4 F (36.9 C), temperature source Oral, resp. rate 18, last menstrual period 08/03/2014.  Physical Exam  Constitutional: She is oriented to person, place, and time. She appears well-developed and well-nourished.  Uncomfortable appearing  HENT:  Head: Normocephalic.  Neck: Normal range of motion. Neck supple.  Cardiovascular: Normal rate, regular rhythm and normal heart sounds.  Exam reveals no gallop and no friction rub.   No murmur heard. Respiratory: Effort normal and breath sounds normal. No respiratory distress.  GI: Soft. She exhibits no mass. There is tenderness (suprapubic). There is no rebound and no guarding.  Genitourinary: There is bleeding (mucusy discharge seen in cervical os; no clots, no active bleeding) in the vagina.  Neurological: She is alert and oriented to person, place, and time.  Skin: Skin is warm and dry.    MAU Course  Procedures 1800 Toradol 60 mg IM given 1900 Pt reports  decrease in pain from 10/10 to 8/8 1915 2 mg IM dilaudid ordered 1950 Pt reports pain 2/10  Results for orders placed or performed during the hospital encounter of 10/11/14 (from the past 24 hour(s))  CBC     Status: Abnormal   Collection Time: 10/11/14  6:40 PM  Result Value Ref Range   WBC 12.4 (H) 4.0 - 10.5 K/uL   RBC 3.23 (L) 3.87 - 5.11 MIL/uL   Hemoglobin 8.5 (L) 12.0 - 15.0 g/dL   HCT 16.1 (L) 09.6 - 04.5 %   MCV 83.0 78.0 - 100.0 fL   MCH 26.3 26.0 - 34.0 pg   MCHC 31.7 30.0 - 36.0 g/dL   RDW 40.9 (H) 81.1 - 91.4 %   Platelets  274 150 - 400 K/uL    Assessment and Plan  Miscarriage Anemia  Plan: Discharge to home Continue Ferrous sulfate RX Tylenol #3 (15) Follow-up in [facility] for CBC recheck and reassessment  Marlis Edelson 10/11/2014, 8:32 PM

## 2014-10-14 ENCOUNTER — Encounter (HOSPITAL_COMMUNITY): Payer: Self-pay

## 2014-10-14 ENCOUNTER — Inpatient Hospital Stay (HOSPITAL_COMMUNITY)
Admission: AD | Admit: 2014-10-14 | Discharge: 2014-10-14 | Disposition: A | Discharge status: Home / Self Care | Payer: Self-pay | Source: Ambulatory Visit | Attending: Obstetrics & Gynecology | Admitting: Obstetrics & Gynecology

## 2014-10-14 DIAGNOSIS — R109 Unspecified abdominal pain: Secondary | ICD-10-CM

## 2014-10-14 DIAGNOSIS — O039 Complete or unspecified spontaneous abortion without complication: Secondary | ICD-10-CM | POA: Insufficient documentation

## 2014-10-14 DIAGNOSIS — R1032 Left lower quadrant pain: Secondary | ICD-10-CM | POA: Insufficient documentation

## 2014-10-14 LAB — CBC WITH DIFFERENTIAL/PLATELET
BASOS ABS: 0 10*3/uL (ref 0.0–0.1)
BASOS PCT: 0 % (ref 0–1)
EOS ABS: 0.2 10*3/uL (ref 0.0–0.7)
Eosinophils Relative: 2 % (ref 0–5)
HEMATOCRIT: 28.5 % — AB (ref 36.0–46.0)
Hemoglobin: 9.2 g/dL — ABNORMAL LOW (ref 12.0–15.0)
Lymphocytes Relative: 12 % (ref 12–46)
Lymphs Abs: 1.3 10*3/uL (ref 0.7–4.0)
MCH: 27 pg (ref 26.0–34.0)
MCHC: 32.3 g/dL (ref 30.0–36.0)
MCV: 83.6 fL (ref 78.0–100.0)
MONOS PCT: 6 % (ref 3–12)
Monocytes Absolute: 0.6 10*3/uL (ref 0.1–1.0)
NEUTROS ABS: 9 10*3/uL — AB (ref 1.7–7.7)
NEUTROS PCT: 80 % — AB (ref 43–77)
PLATELETS: 294 10*3/uL (ref 150–400)
RBC: 3.41 MIL/uL — ABNORMAL LOW (ref 3.87–5.11)
RDW: 18.7 % — ABNORMAL HIGH (ref 11.5–15.5)
WBC: 11.1 10*3/uL — ABNORMAL HIGH (ref 4.0–10.5)

## 2014-10-14 LAB — URINALYSIS, ROUTINE W REFLEX MICROSCOPIC
Bilirubin Urine: NEGATIVE
GLUCOSE, UA: NEGATIVE mg/dL
Hgb urine dipstick: NEGATIVE
KETONES UR: NEGATIVE mg/dL
Leukocytes, UA: NEGATIVE
Nitrite: NEGATIVE
Protein, ur: NEGATIVE mg/dL
Specific Gravity, Urine: 1.025 (ref 1.005–1.030)
UROBILINOGEN UA: 0.2 mg/dL (ref 0.0–1.0)
pH: 6.5 (ref 5.0–8.0)

## 2014-10-14 LAB — HCG, QUANTITATIVE, PREGNANCY: HCG, BETA CHAIN, QUANT, S: 6366 m[IU]/mL — AB (ref <5)

## 2014-10-14 MED ORDER — OXYCODONE-ACETAMINOPHEN 5-325 MG PO TABS: 1.0000 | ORAL_TABLET | Freq: Four times a day (QID) | ORAL | Status: DC | PRN

## 2014-10-14 MED ORDER — KETOROLAC TROMETHAMINE 60 MG/2ML IM SOLN
60.0000 mg | Freq: Once | INTRAMUSCULAR | Status: AC
  Administered 2014-10-14: 60 mg via INTRAMUSCULAR
  Filled 2014-10-14: qty 2

## 2014-10-14 MED ORDER — KETOROLAC TROMETHAMINE 10 MG PO TABS: 10.0000 mg | ORAL_TABLET | Freq: Four times a day (QID) | ORAL | Status: DC

## 2014-10-14 NOTE — Discharge Instructions (Signed)
Miscarriage A miscarriage is the sudden loss of an unborn baby (fetus) before the 20th week of pregnancy. Most miscarriages happen in the first 3 months of pregnancy. Sometimes, it happens before a woman even knows she is pregnant. A miscarriage is also called a "spontaneous miscarriage" or "early pregnancy loss." Having a miscarriage can be an emotional experience. Talk with your caregiver about any questions you may have about miscarrying, the grieving process, and your future pregnancy plans. CAUSES   Problems with the fetal chromosomes that make it impossible for the baby to develop normally. Problems with the baby's genes or chromosomes are most often the result of errors that occur, by chance, as the embryo divides and grows. The problems are not inherited from the parents.  Infection of the cervix or uterus.   Hormone problems.   Problems with the cervix, such as having an incompetent cervix. This is when the tissue in the cervix is not strong enough to hold the pregnancy.   Problems with the uterus, such as an abnormally shaped uterus, uterine fibroids, or congenital abnormalities.   Certain medical conditions.   Smoking, drinking alcohol, or taking illegal drugs.   Trauma.  Often, the cause of a miscarriage is unknown.  SYMPTOMS   Vaginal bleeding or spotting, with or without cramps or pain.  Pain or cramping in the abdomen or lower back.  Passing fluid, tissue, or blood clots from the vagina. DIAGNOSIS  Your caregiver will perform a physical exam. You may also have an ultrasound to confirm the miscarriage. Blood or urine tests may also be ordered. TREATMENT   Sometimes, treatment is not necessary if you naturally pass all the fetal tissue that was in the uterus. If some of the fetus or placenta remains in the body (incomplete miscarriage), tissue left behind may become infected and must be removed. Usually, a dilation and curettage (D and C) procedure is performed.  During a D and C procedure, the cervix is widened (dilated) and any remaining fetal or placental tissue is gently removed from the uterus.  Antibiotic medicines are prescribed if there is an infection. Other medicines may be given to reduce the size of the uterus (contract) if there is a lot of bleeding.  If you have Rh negative blood and your baby was Rh positive, you will need a Rh immunoglobulin shot. This shot will protect any future baby from having Rh blood problems in future pregnancies. HOME CARE INSTRUCTIONS   Your caregiver may order bed rest or may allow you to continue light activity. Resume activity as directed by your caregiver.  Have someone help with home and family responsibilities during this time.   Keep track of the number of sanitary pads you use each day and how soaked (saturated) they are. Write down this information.   Do not use tampons. Do not douche or have sexual intercourse until approved by your caregiver.   Only take over-the-counter or prescription medicines for pain or discomfort as directed by your caregiver.   Do not take aspirin. Aspirin can cause bleeding.   Keep all follow-up appointments with your caregiver.   If you or your partner have problems with grieving, talk to your caregiver or seek counseling to help cope with the pregnancy loss. Allow enough time to grieve before trying to get pregnant again.  SEEK IMMEDIATE MEDICAL CARE IF:   You have severe cramps or pain in your back or abdomen.  You have a fever.  You pass large blood clots (walnut-sized   or larger) ortissue from your vagina. Save any tissue for your caregiver to inspect.   Your bleeding increases.   You have a thick, bad-smelling vaginal discharge.  You become lightheaded, weak, or you faint.   You have chills.  MAKE SURE YOU:  Understand these instructions.  Will watch your condition.  Will get help right away if you are not doing well or get  worse. Document Released: 08/26/2000 Document Revised: 06/27/2012 Document Reviewed: 04/21/2011 ExitCare Patient Information 2015 [employer]. This information is not intended to replace advice given to you by your health care provider. Make sure you discuss any questions you have with your health care provider.  

## 2014-10-14 NOTE — MAU Note (Signed)
Pt presents complaining of cramping in her lower left quadrant and rectal pain that started today. Pt states she is still having bleeding.

## 2014-10-14 NOTE — MAU Provider Note (Signed)
Chief Complaint: No chief complaint on file.   First Provider Initiated Contact with Patient 10/14/14 1817     SUBJECTIVE HPI: Allison Briggs is a 24 y.o. G1P0 at 6 days S/P SAB who presents to [facility] reporting severe LLQ cramping wrapping around left low back. Had been starting to feel better since SAB until yesterday when she started having severe cramping. Passed a medium-sized clot and had some improvement in pain, but it returned a few hours later. Minimal relief w/ Tylenol #3. Has not resumed IC since SAB.   10/01/14: US showed 9.1 week Live IUP 10/08/14: Passed tissue. Pathology showed mostly intact GS. No fetus. US showed 35 mm Thickened/heterogeneous endometrium with associated blood products/debris within the endometrial cavity 10/11/14: MAU visit for abd pain, N/V. Rx Tylenol #3. CBC showed dropping WBC 12.4. Hgb 8.5, but bleeding stable.   Past Medical History  Diagnosis Date  . BV (bacterial vaginosis)     09/15/13  . STD (sexually transmitted disease)     h/o chlamydia, RRP  . Trichimoniasis   . Vaginal Pap smear, abnormal   . Anemia    OB History  Gravida Para Term Preterm AB SAB TAB Ectopic Multiple Living  1 0        0    # Outcome Date GA Lbr Len/2nd Weight Sex Delivery Anes PTL Lv  1 Gravida              Past Surgical History  Procedure Laterality Date  . Cervical biopsy  2016   History   Social History  . Marital Status: Single    Spouse Name: N/A  . Number of Children: N/A  . Years of Education: N/A   Occupational History  . Not on file.   Social History Main Topics  . Smoking status: Never Smoker   . Smokeless tobacco: Never Used  . Alcohol Use: No  . Drug Use: Yes    Special: Marijuana  . Sexual Activity: Yes    Birth Control/ Protection: Pill   Other Topics Concern  . Not on file   Social History Narrative   No current facility-administered medications on file prior to encounter.   Current Outpatient Prescriptions on File  Prior to Encounter  Medication Sig Dispense Refill  . Acetaminophen-Codeine (TYLENOL/CODEINE #3) 300-30 MG per tablet Take 1 tablet by mouth every 4 (four) hours as needed for pain. 15 tablet 0  . ferrous sulfate (FERROUSUL) 325 (65 FE) MG tablet Take 1 tablet (325 mg total) by mouth 2 (two) times daily. 60 tablet 1  . ibuprofen (ADVIL,MOTRIN) 600 MG tablet Take 1 tablet (600 mg total) by mouth every 6 (six) hours as needed. (Patient taking differently: Take 600 mg by mouth every 6 (six) hours as needed for mild pain. ) 30 tablet 0   Allergies  Allergen Reactions  . Latex Itching and Other (See Comments)    Reaction:  Redness     Review of Systems  Constitutional: Positive for chills. Negative for fever and malaise/fatigue.  Gastrointestinal: Positive for nausea, vomiting, abdominal pain and constipation. Negative for diarrhea.  Genitourinary: Negative for dysuria, urgency, frequency, hematuria and flank pain.       Small amount of VB. Passage of medium-sized clot. No passage of tissue.   Musculoskeletal: Positive for back pain. Negative for myalgias.  Neurological: Negative for dizziness and weakness.    OBJECTIVE Blood pressure 125/75, pulse 66, temperature 98 F (36.7 C), temperature source Oral, resp. rate 18, last menstrual period  08/03/2014, unknown if currently breastfeeding. GENERAL: Well-developed, well-nourished female intermittently in moderate distress. Calms down when instructed to.  HEART: normal rate RESP: normal effort GI: Abdomen soft, non-tender. Positive bowel sounds 4. Voluntary guarding, but able to relax when instructed. No tenderness, mass or rebound tenderness. MS: Nontender, no edema NEURO: Alert and oriented GU EXAM: NEFG, scant dark red blood noted, uterus slightly enlarged, NT, no adnexal tenderness or masses. No CMT.   LAB RESULTS Results for orders placed or performed during the hospital encounter of 10/14/14 (from the past 24 hour(s))  CBC with  Differential/Platelet     Status: Abnormal   Collection Time: 10/14/14  6:20 PM  Result Value Ref Range   WBC 11.1 (H) 4.0 - 10.5 K/uL   RBC 3.41 (L) 3.87 - 5.11 MIL/uL   Hemoglobin 9.2 (L) 12.0 - 15.0 g/dL   HCT 81.1 (L) 91.4 - 78.2 %   MCV 83.6 78.0 - 100.0 fL   MCH 27.0 26.0 - 34.0 pg   MCHC 32.3 30.0 - 36.0 g/dL   RDW 95.6 (H) 21.3 - 08.6 %   Platelets 294 150 - 400 K/uL   Neutrophils Relative % 80 (H) 43 - 77 %   Neutro Abs 9.0 (H) 1.7 - 7.7 K/uL   Lymphocytes Relative 12 12 - 46 %   Lymphs Abs 1.3 0.7 - 4.0 K/uL   Monocytes Relative 6 3 - 12 %   Monocytes Absolute 0.6 0.1 - 1.0 K/uL   Eosinophils Relative 2 0 - 5 %   Eosinophils Absolute 0.2 0.0 - 0.7 K/uL   Basophils Relative 0 0 - 1 %   Basophils Absolute 0.0 0.0 - 0.1 K/uL  hCG, quantitative, pregnancy     Status: Abnormal   Collection Time: 10/14/14  6:20 PM  Result Value Ref Range   hCG, Beta Chain, Quant, S 6366 (H) <5 mIU/mL  Urinalysis, Routine w reflex microscopic (not at [facility])     Status: None   Collection Time: 10/14/14  6:30 PM  Result Value Ref Range   Color, Urine YELLOW YELLOW   APPearance CLEAR CLEAR   Specific Gravity, Urine 1.025 1.005 - 1.030   pH 6.5 5.0 - 8.0   Glucose, UA NEGATIVE NEGATIVE mg/dL   Hgb urine dipstick NEGATIVE NEGATIVE   Bilirubin Urine NEGATIVE NEGATIVE   Ketones, ur NEGATIVE NEGATIVE mg/dL   Protein, ur NEGATIVE NEGATIVE mg/dL   Urobilinogen, UA 0.2 0.0 - 1.0 mg/dL   Nitrite NEGATIVE NEGATIVE   Leukocytes, UA NEGATIVE NEGATIVE    IMAGING US Ob Transvaginal  10/08/2014   CLINICAL DATA:  Pregnant, heavy vaginal bleeding (passing clots), lower pelvic pain  EXAM: TRANSVAGINAL OB ULTRASOUND  TECHNIQUE: Transvaginal ultrasound was performed for complete evaluation of the gestation as well as the maternal uterus, adnexal regions, and pelvic cul-de-sac.  COMPARISON:  10/01/2014  FINDINGS: Intrauterine gestational sac: Not visualized  Yolk sac:  Not visualized  Embryo:  Not visualized   Maternal uterus/adnexae: Thickened/heterogeneous endometrium, measuring 25 mm, with associated blood products/debris within the endometrial cavity.  Bilateral ovaries are within normal limits.  No free fluid.  IMPRESSION: No IUP is visualized.  Thickened/heterogeneous endometrium with associated blood products/debris within the endometrial cavity.  Given the clinical scenario, this appearance likely reflects abortion in progress.   Electronically Signed   By: Charline Bills M.D.   On: 10/08/2014 08:40   US Ob Transvaginal  10/01/2014   CLINICAL DATA:  Pelvic/abdominal cramping. Past blood clot per vagina last evening. Patient  8 weeks and 3 days pregnant based on her last menstrual period.  EXAM: TRANSVAGINAL OB ULTRASOUND  TECHNIQUE: Transvaginal ultrasound was performed for complete evaluation of the gestation as well as the maternal uterus, adnexal regions, and pelvic cul-de-sac.  COMPARISON:  09/12/2014  FINDINGS: Intrauterine gestational sac: Visualized/normal in shape.  Yolk sac:  Yes  Embryo:  Yes  Cardiac Activity: Yes  Heart Rate: 172 bpm  CRL:   23.7  mm   9 w 1 d                  [place] EDC: 05/05/2015  Maternal uterus/adnexae: No uterine masses. No subchorionic hemorrhage. Cervix is closed. Ovaries unremarkable. Corpus luteum arises from the right ovary. No pelvic free fluid. No adnexal masses.  IMPRESSION: Single live intrauterine pregnancy with a measured gestational age of [redacted] weeks 1 day, showing normal interval growth from the recent prior obstetrical ultrasound. No emergent complication.   Electronically Signed   By: Amie Portland M.D.   On: 10/01/2014 16:31    MAU COURSE CBC w/ dif, quant, Toradol.   Mild improvement in pain, but pt in NAD, talking on phone.   MDM Pt initially very uncomfortable. Eval for septic abortion, retained POCs, but lack of uterine tenderness, dropping WBCs, no fever, large drop in quant and pathology report showing complete GC point away from those Dx. Pain likely  from passage of remaining clots. Pt asking why this is worse than menstrual cramps. Explained that SAB is a completely different process than menstrual period--usually much worse cramping.   ASSESSMENT 1. Complete spontaneous abortion   2. Abdominal pain in female    PLAN Discharge home in stable condition per consult w/ Dr. Debroah Loop. F/U quant in one week due to extremely high quant (178K) at 9 weeks.  D/C Ibuprofen and Tylenol #3. Take Toradol on scheduled for next 3 days and Percocet only for breakthrough pain. Warm compresses as needed.  Follow-up Information    Follow up with [facility] On 10/22/2014.   Specialty:  Obstetrics and Gynecology   Why:  between 8:00 and 11:00 for repeat blood work   Contact information:   [address] [place] [address] [phone]      Follow up with [facility].   Why:  As needed in emergencies   Contact information:   [address] [id number] mc [place] [address] [phone]        Medication List    STOP taking these medications        Acetaminophen-Codeine 300-30 MG per tablet  Commonly known as:  TYLENOL/CODEINE #3     ibuprofen 600 MG tablet  Commonly known as:  ADVIL,MOTRIN      TAKE these medications        ferrous sulfate 325 (65 FE) MG tablet  Commonly known as:  FERROUSUL  Take 1 tablet (325 mg total) by mouth 2 (two) times daily.     ketorolac 10 MG tablet  Commonly known as:  TORADOL  Take 1 tablet (10 mg total) by mouth every 6 (six) hours.     oxyCODONE-acetaminophen 5-325 MG per tablet  Commonly known as:  PERCOCET/ROXICET  Take 1-2 tablets by mouth every 6 (six) hours as needed (for breakthrough pain).       [place], [place] 10/14/2014  7:14 PM

## 2014-10-22 ENCOUNTER — Other Ambulatory Visit: Payer: Self-pay

## 2014-10-22 DIAGNOSIS — O209 Hemorrhage in early pregnancy, unspecified: Secondary | ICD-10-CM

## 2014-10-23 ENCOUNTER — Telehealth: Payer: Self-pay | Admitting: *Deleted

## 2014-10-23 LAB — HCG, QUANTITATIVE, PREGNANCY: hCG, Beta Chain, Quant, S: 878.9 m[IU]/mL

## 2014-10-23 NOTE — Telephone Encounter (Signed)
Pt left message requesting hormone level results. Called pt back after reviewing results. Left message for pt to call back and state whether a detailed message can be left on her voice mail. **Per Dr. Adrian Blackwater - hormone level has decreased significantly, no follow up needed unless she does not have menses within 8 weeks. She should not have unprotected sex during that time. His note is entered on HCGQ result from 10/22/14.

## 2014-10-23 NOTE — Telephone Encounter (Signed)
Pt returned call to clinic.  Contacted patient, hormone level results given with recommendations by Dr. Adrian Blackwater.  Pt verbalizes understanding and has no further questions.

## 2014-10-26 ENCOUNTER — Ambulatory Visit: Payer: Self-pay | Admitting: Obstetrics & Gynecology

## 2014-11-01 ENCOUNTER — Telehealth: Payer: Self-pay | Admitting: *Deleted

## 2014-11-01 NOTE — Telephone Encounter (Signed)
Called patient stating we are returning your phone call. Asked patient if she was having bleeding like a period. Patient states no only spotting. Told patient that sounds fine since she is not having heavy bleeding. Told patient if she were to have heavy bleeding she would need to come in. Patient verbalized understanding and states she has an appt on 9/9. Told patient that will be a good time to follow up to make sure she is doing well following her miscarriage and not having any issues. Patient verbalized understanding and had no other questions

## 2014-11-01 NOTE — Telephone Encounter (Signed)
Received message left on nurse line on 11/01/14 at 0953.  Patient states she had a miscarriage approximately 3 - 4 weeks ago.  States she was told she could resume sexual intercourse and has done so.  States when she went to the bathroom she passed a "huge" blood clot.  Requests a call back.

## 2014-11-23 ENCOUNTER — Ambulatory Visit (INDEPENDENT_AMBULATORY_CARE_PROVIDER_SITE_OTHER): Payer: Self-pay | Admitting: Obstetrics and Gynecology

## 2014-11-23 ENCOUNTER — Encounter: Payer: Self-pay | Admitting: Obstetrics and Gynecology

## 2014-11-23 ENCOUNTER — Other Ambulatory Visit (HOSPITAL_COMMUNITY): Admission: RE | Admit: 2014-11-23 | Payer: Self-pay | Source: Ambulatory Visit | Admitting: Obstetrics and Gynecology

## 2014-11-23 VITALS — BP 92/72 | HR 80 | Temp 98.7°F | Wt 133.8 lb

## 2014-11-23 DIAGNOSIS — N870 Mild cervical dysplasia: Secondary | ICD-10-CM | POA: Insufficient documentation

## 2014-11-23 DIAGNOSIS — N926 Irregular menstruation, unspecified: Secondary | ICD-10-CM

## 2014-11-23 DIAGNOSIS — Z118 Encounter for screening for other infectious and parasitic diseases: Secondary | ICD-10-CM

## 2014-11-23 DIAGNOSIS — Z113 Encounter for screening for infections with a predominantly sexual mode of transmission: Secondary | ICD-10-CM

## 2014-11-23 DIAGNOSIS — N939 Abnormal uterine and vaginal bleeding, unspecified: Secondary | ICD-10-CM | POA: Insufficient documentation

## 2014-11-23 LAB — CBC
HEMATOCRIT: 31.9 % — AB (ref 36.0–46.0)
Hemoglobin: 10.1 g/dL — ABNORMAL LOW (ref 12.0–15.0)
MCH: 27.1 pg (ref 26.0–34.0)
MCHC: 31.7 g/dL (ref 30.0–36.0)
MCV: 85.5 fL (ref 78.0–100.0)
MPV: 9.1 fL (ref 8.6–12.4)
Platelets: 299 10*3/uL (ref 150–400)
RBC: 3.73 MIL/uL — AB (ref 3.87–5.11)
RDW: 15.9 % — AB (ref 11.5–15.5)
WBC: 5.7 10*3/uL (ref 4.0–10.5)

## 2014-11-23 NOTE — Progress Notes (Signed)
CLINIC ENCOUNTER NOTE  History:  24 y.o. G1P0 here today for f/u SAB.  Miscarriage in July. Has been spotting ever since, occasionally clots. No fever or chills or nausea. Occasionally cramps with passage of clots, otherwise no pain.  Past Medical History  Diagnosis Date  . BV (bacterial vaginosis)     09/15/13  . STD (sexually transmitted disease)     h/o chlamydia, RRP  . Trichimoniasis   . Vaginal Pap smear, abnormal   . Anemia     Past Surgical History  Procedure Laterality Date  . Cervical biopsy  2016    The following portions of the patient's history were reviewed and updated as appropriate: allergies, current medications, past family history, past medical history, past social history, past surgical history and problem list.    Review of Systems:  See above; comprehensive review of systems was otherwise negative.  Objective:  Physical Exam BP 92/72 mmHg  Pulse 80  Temp(Src) 98.7 F (37.1 C)  Wt 133 lb 12.8 oz (60.691 kg)  LMP  (LMP Unknown)  Breastfeeding? No CONSTITUTIONAL: Well-developed, well-nourished female in no acute distress.  HENT:  Normocephalic, atraumatic SKIN: Skin is warm and dry.  NEUROLGIC: Alert  PSYCHIATRIC: Normal mood and affect.  CARDIOVASCULAR: Normal heart rate noted RESPIRATORY: Effort and breath sounds normal, no problems with respiration noted ABDOMEN: Soft, no distention noted.  No tenderness, rebound or guarding.  PELVIC: Mod amount dark red blood in normal vagina, normal cervix with small amount blood-tinged mucous at os, no cmt or adnexal tenderness/fullness   Labs and Imaging No results found.  Assessment & Plan:   # Vaginal bleeding - Incomplete AB vs abnormal uterine bleeding - no signs or symptoms of infection, not symptomatic from bleeding - check quant hcg and cbc today - TVUS - f/u after TVUS; may need medical vs. 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     Marcin Holte B. Caidence Higashi, MD OB/GYN Fellow [facility] for [employer], [facility]

## 2014-11-24 LAB — HCG, QUANTITATIVE, PREGNANCY: hCG, Beta Chain, Quant, S: 17.2 m[IU]/mL

## 2014-11-26 ENCOUNTER — Telehealth: Payer: Self-pay | Admitting: *Deleted

## 2014-11-26 LAB — GC/CHLAMYDIA PROBE AMP (~~LOC~~) NOT AT ARMC
Chlamydia: NEGATIVE
Neisseria Gonorrhea: NEGATIVE

## 2014-11-26 NOTE — Telephone Encounter (Signed)
Returned call, no answer. Voicemail left to return call.

## 2014-11-26 NOTE — Telephone Encounter (Signed)
Pt called requesting results from her visit last week.

## 2014-11-27 ENCOUNTER — Ambulatory Visit (HOSPITAL_COMMUNITY)
Admission: RE | Admit: 2014-11-27 | Discharge: 2014-11-27 | Disposition: A | Discharge status: Home / Self Care | Payer: Self-pay | Source: Ambulatory Visit | Attending: Obstetrics and Gynecology | Admitting: Obstetrics and Gynecology

## 2014-11-27 ENCOUNTER — Other Ambulatory Visit: Payer: Self-pay | Admitting: General Practice

## 2014-11-27 ENCOUNTER — Telehealth: Payer: Self-pay | Admitting: General Practice

## 2014-11-27 DIAGNOSIS — N926 Irregular menstruation, unspecified: Secondary | ICD-10-CM

## 2014-11-27 DIAGNOSIS — N8320 Unspecified ovarian cysts: Secondary | ICD-10-CM | POA: Insufficient documentation

## 2014-11-27 NOTE — Telephone Encounter (Signed)
Per Dr Ashok Pall, patient had normal u/s. Patient already has appt on 9/23 for follow up on bleeding. Called patient earlier today already and left message to have her call us back

## 2014-11-27 NOTE — Telephone Encounter (Signed)
Called patient, no answer- left message stating we are trying to reach you to return your phone call, please call us back at the clinics if you still need assistance 

## 2014-11-28 NOTE — Telephone Encounter (Signed)
Patient called and left message stating she is returning our phone call.  

## 2014-11-30 NOTE — Telephone Encounter (Signed)
Contacted patient, ultrasound results given, pt has appointment on 9/21 @ 2:45pm. Pt verbalizes understanding.

## 2014-12-05 ENCOUNTER — Ambulatory Visit: Payer: Self-pay | Admitting: Obstetrics and Gynecology

## 2014-12-10 ENCOUNTER — Telehealth: Payer: Self-pay | Admitting: General Practice

## 2014-12-10 NOTE — Telephone Encounter (Signed)
Patient called and left message stating she just saw her ultrasound results on her mychart. Patient states the ultrasound shows a cyst on her right ovary. Patient requests call back as she has some questions

## 2014-12-17 NOTE — Telephone Encounter (Signed)
Attempted to contact patient, no answer, left message that we are returning call.

## 2014-12-24 NOTE — Telephone Encounter (Signed)
Per previous telephone encounter patient aware of results, has appointment on Wednesday.

## 2014-12-26 ENCOUNTER — Telehealth: Payer: Self-pay | Admitting: General Practice

## 2014-12-26 ENCOUNTER — Ambulatory Visit: Payer: Self-pay | Admitting: Obstetrics & Gynecology

## 2014-12-26 NOTE — Telephone Encounter (Signed)
Patient called and left message stating she started her period but it is not normal. Called patient back and she states this is her first cycle following her miscarriage and it has been very heavy. Told patient it can take a couple cycles for things to return back to normal and reminded her of appt today in our office. Patient verbalized understanding and states she is unsure if she can make that appt or not but will call us back and let us know. Patient had no other questions

## 2014-12-27 ENCOUNTER — Ambulatory Visit (INDEPENDENT_AMBULATORY_CARE_PROVIDER_SITE_OTHER): Payer: Self-pay | Admitting: Obstetrics and Gynecology

## 2014-12-27 ENCOUNTER — Encounter: Payer: Self-pay | Admitting: Obstetrics and Gynecology

## 2014-12-27 VITALS — BP 117/68 | HR 68 | Temp 99.0°F | Ht 70.0 in | Wt 135.0 lb

## 2014-12-27 DIAGNOSIS — N760 Acute vaginitis: Secondary | ICD-10-CM

## 2014-12-27 MED ORDER — NORGESTIMATE-ETH ESTRADIOL 0.25-35 MG-MCG PO TABS: 1.0000 | ORAL_TABLET | Freq: Every day | ORAL | Status: AC

## 2014-12-27 NOTE — Progress Notes (Signed)
Patient ID: Allison Briggs, female   DOB: 06/15/1990, 24 y.o.   MRN: [id number] 24 yo here to discuss results of September 2016 ultrasound. Patient reports feeling well and has had a normal cycle since. She denies any pelvic pain. She does report the presence of a vaginal discharge with odor for the past 4 days. Patient is sexually active using notning for contraception. Patient is interested in contraception at this time.  Past Medical History  Diagnosis Date  . BV (bacterial vaginosis)     09/15/13  . STD (sexually transmitted disease)     h/o chlamydia, RRP  . Trichimoniasis   . Vaginal Pap smear, abnormal   . Anemia    Past Surgical History  Procedure Laterality Date  . Cervical biopsy  2016   Family History  Problem Relation Age of Onset  . Hypertension Maternal Grandmother   . Diabetes Maternal Grandmother   . Hypertension Paternal Grandmother   . Diabetes Other     great uncle maternal   Social History  Substance Use Topics  . Smoking status: Never Smoker   . Smokeless tobacco: Never Used  . Alcohol Use: No   ROS See pertinent in HPI  GENERAL: Well-developed, well-nourished female in no acute distress.  ABDOMEN: Soft, nontender, nondistended. No organomegaly. PELVIC: Normal external female genitalia. Vagina is pink and rugated.  Normal discharge. Normal appearing cervix. Uterus is normal in size. No adnexal mass or tenderness. EXTREMITIES: No cyanosis, clubbing, or edema, 2+ distal pulses.  11/2014 Ultrasound FINDINGS: Uterus  Measurements: 8.7 x 5.1 x 6.3 cm. No fibroids or other mass visualized.  Endometrium  Thickness: 8 mm in thickness. No visible retained products of conception.  Right ovary  Measurements: 4.8 x 2.4 x 4.8 cm. Minimally complex cyst with internal echoes and reticulation measures up to 3.8 cm, likely hemorrhagic cyst.  Left ovary  Measurements: 4.4 x 2.2 x 2.5 cm. Normal appearance/no adnexal mass.  Other findings  No free  fluid.  IMPRESSION: No evidence of retained products of conception. Endometrium 8 mm in thickness  Minimally complex 3.8 cm right ovarian cyst most compatible with hemorrhagic cyst.  A/P 24 yo with vaginitis - Ultrasound results reviewed with the patient - Wet prep collected - Contraception options reviewed and discussed with the patient and the patient opted for OCP. She has used them in the past without any contraindications - Patient will be contacted with any abnormal results - RTC in December for annual exam

## 2014-12-28 LAB — WET PREP, GENITAL
Clue Cells Wet Prep HPF POC: NONE SEEN
TRICH WET PREP: NONE SEEN
WBC, Wet Prep HPF POC: NONE SEEN
YEAST WET PREP: NONE SEEN

## 2015-01-21 ENCOUNTER — Encounter: Payer: Self-pay | Admitting: *Deleted

## 2015-01-24 ENCOUNTER — Encounter: Payer: Self-pay | Admitting: Obstetrics and Gynecology

## 2015-01-24 ENCOUNTER — Other Ambulatory Visit: Payer: Self-pay | Admitting: General Practice

## 2015-01-24 DIAGNOSIS — B9689 Other specified bacterial agents as the cause of diseases classified elsewhere: Secondary | ICD-10-CM

## 2015-01-24 DIAGNOSIS — N760 Acute vaginitis: Principal | ICD-10-CM

## 2015-01-24 MED ORDER — METRONIDAZOLE 500 MG PO TABS: 500.0000 mg | ORAL_TABLET | Freq: Two times a day (BID) | ORAL | Status: DC

## 2015-02-04 ENCOUNTER — Encounter (HOSPITAL_COMMUNITY): Payer: Self-pay | Admitting: *Deleted

## 2015-02-04 ENCOUNTER — Inpatient Hospital Stay (HOSPITAL_COMMUNITY)
Admission: AD | Admit: 2015-02-04 | Discharge: 2015-02-04 | Disposition: A | Discharge status: Home / Self Care | Payer: Self-pay | Source: Ambulatory Visit | Attending: Family Medicine | Admitting: Family Medicine

## 2015-02-04 DIAGNOSIS — N939 Abnormal uterine and vaginal bleeding, unspecified: Secondary | ICD-10-CM

## 2015-02-04 LAB — URINALYSIS, ROUTINE W REFLEX MICROSCOPIC
BILIRUBIN URINE: NEGATIVE
GLUCOSE, UA: NEGATIVE mg/dL
Ketones, ur: NEGATIVE mg/dL
Leukocytes, UA: NEGATIVE
NITRITE: NEGATIVE
PH: 7 (ref 5.0–8.0)
Protein, ur: NEGATIVE mg/dL
Specific Gravity, Urine: 1.025 (ref 1.005–1.030)

## 2015-02-04 LAB — URINE MICROSCOPIC-ADD ON
Bacteria, UA: NONE SEEN
WBC, UA: NONE SEEN WBC/hpf (ref 0–5)

## 2015-02-04 LAB — POCT PREGNANCY, URINE: Preg Test, Ur: NEGATIVE

## 2015-02-04 MED ORDER — IBUPROFEN 600 MG PO TABS: 600.0000 mg | ORAL_TABLET | Freq: Four times a day (QID) | ORAL | Status: DC | PRN

## 2015-02-04 NOTE — MAU Note (Signed)
Had been cramping really bad last 2 days.  Pain pills not helping.  Has  Been bleeding past wk, had neg home test.  Bleeding became heavy yesterday, passed some clots today, looks like tissue. (took photo, did not bring in )

## 2015-02-04 NOTE — Discharge Instructions (Signed)

## 2015-02-04 NOTE — MAU Provider Note (Signed)
History     CSN: [id number]  Arrival date and time: 02/04/15 1441   None     Chief Complaint  Patient presents with  . Abdominal Cramping   HPI   Ms. Allison Briggs is a 24 y.o. female G1P0010  Presents today with vaginal bleeding that started 2 days, this is the normal time for her menstrual cycle.  Bleeding got heavy yesterday and pt states she noticed clots and tissue. States she normally has heavy periods with vomiting and cramps. She states she was once told that she had a history of endometriosis, but was unable to follow-up with OBGYN because she had no insurance. States her cramping is on lower L groin and is intermittent. She states pain improved after she passed the clots and tissue earlier this morning. She has taken Tylenol for cramping with some relief. Currently rates pain 2/10. She is sexually active and is on Ortho-cyclen. She denies dysuria, back pain, flank pain, Denies N/V/D. Denies fever, chills.    She has been on birth control pills for one month.   Denies pain.   OB History    Gravida Para Term Preterm AB TAB SAB Ectopic Multiple Living   1 0 0 0 1 0 1 0 0 0       Past Medical History  Diagnosis Date  . BV (bacterial vaginosis)     09/15/13  . STD (sexually transmitted disease)     h/o chlamydia, RRP  . Trichimoniasis   . Vaginal Pap smear, abnormal   . Anemia     Past Surgical History  Procedure Laterality Date  . Cervical biopsy  2016    Family History  Problem Relation Age of Onset  . Hypertension Maternal Grandmother   . Diabetes Maternal Grandmother   . Hypertension Paternal Grandmother   . Diabetes Other     great uncle maternal    Social History  Substance Use Topics  . Smoking status: Never Smoker   . Smokeless tobacco: Never Used  . Alcohol Use: No    Allergies:  Allergies  Allergen Reactions  . Latex Itching and Other (See Comments)    Reaction:  Redness     Prescriptions prior to admission  Medication Sig Dispense  Refill Last Dose  . acetaminophen (TYLENOL) 500 MG tablet Take 1,500 mg by mouth every 6 (six) hours as needed for moderate pain.   02/04/2015 at Unknown time  . diphenhydrAMINE (BENADRYL) 25 mg capsule Take 25 mg by mouth every 6 (six) hours as needed for allergies.   02/03/2015 at Unknown time  . norgestimate-ethinyl estradiol (ORTHO-CYCLEN,SPRINTEC,PREVIFEM) 0.25-35 MG-MCG tablet Take 1 tablet by mouth daily. 1 Package 11 02/03/2015 at Unknown time  . metroNIDAZOLE (FLAGYL) 500 MG tablet Take 1 tablet (500 mg total) by mouth 2 (two) times daily. (Patient not taking: Reported on 02/04/2015) 14 tablet 0    Results for orders placed or performed during the hospital encounter of 02/04/15 (from the past 48 hour(s))  Urinalysis, Routine w reflex microscopic (not at [facility])     Status: Abnormal   Collection Time: 02/04/15  3:15 PM  Result Value Ref Range   Color, Urine YELLOW YELLOW   APPearance HAZY (A) CLEAR   Specific Gravity, Urine 1.025 1.005 - 1.030   pH 7.0 5.0 - 8.0   Glucose, UA NEGATIVE NEGATIVE mg/dL   Hgb urine dipstick LARGE (A) NEGATIVE   Bilirubin Urine NEGATIVE NEGATIVE   Ketones, ur NEGATIVE NEGATIVE mg/dL   Protein, ur NEGATIVE NEGATIVE mg/dL  Nitrite NEGATIVE NEGATIVE   Leukocytes, UA NEGATIVE NEGATIVE  Urine microscopic-add on     Status: Abnormal   Collection Time: 02/04/15  3:15 PM  Result Value Ref Range   Squamous Epithelial / LPF 0-5 (A) NONE SEEN    Comment: Please note change in reference range.   WBC, UA NONE SEEN 0 - 5 WBC/hpf    Comment: Please note change in reference range.   RBC / HPF TOO NUMEROUS TO COUNT 0 - 5 RBC/hpf    Comment: Please note change in reference range.   Bacteria, UA NONE SEEN NONE SEEN    Comment: Please note change in reference range.   Urine-Other MUCOUS PRESENT   Pregnancy, urine POC     Status: None   Collection Time: 02/04/15  3:35 PM  Result Value Ref Range   Preg Test, Ur NEGATIVE NEGATIVE    Comment:        THE SENSITIVITY  OF THIS METHODOLOGY IS >24 mIU/mL     Review of Systems  Constitutional: Negative for fever and chills.  Gastrointestinal: Negative for nausea, vomiting, abdominal pain (None currently) and diarrhea.  Genitourinary: Negative for dysuria, urgency and flank pain.  Musculoskeletal: Negative for back pain.  Neurological: Negative for dizziness.  All other systems reviewed and are negative.  Physical Exam   Blood pressure 122/70, pulse 75, temperature 98.8 F (37.1 C), temperature source Oral, resp. rate 18, height  (1.702 m), weight 138 lb 3.2 oz (62.687 kg), last menstrual period 12/31/2014.  Physical Exam  Constitutional: She is oriented to person, place, and time. She appears well-developed and well-nourished.  HENT:  Head: Normocephalic and atraumatic.  Cardiovascular: Normal rate, regular rhythm, normal heart sounds and intact distal pulses.  Exam reveals no gallop and no friction rub.   No murmur heard. Respiratory: Effort normal and breath sounds normal. No respiratory distress. She has no wheezes. She exhibits no tenderness.  GI: Soft. Bowel sounds are normal. She exhibits no mass. There is no tenderness. There is no rebound and no guarding.  Genitourinary:  Speculum exam: Vagina - Small amount of dark red blood noted in the posterior vagina.  Cervix - + active bleeding, patient on menses  Bimanual exam: Cervix closed, no CMT  Uterus non tender, normal size Adnexa non tender, no masses bilaterally Chaperone present for exam.  Neurological: She is alert and oriented to person, place, and time.  Skin: Skin is warm and dry. No rash noted.  Psychiatric: She has a normal mood and affect. Her behavior is normal. Judgment and thought content normal.    MAU Course  Procedures  None  MDM Patient is without pain. Patient worried about tissue in her vaginal bleeding, UPT negative and patient feels better knowing this. Denies the need for pain medication at this time,  however has asked for an RX for ibuprofen for future menses.   Assessment and Plan    A:  1. Abnormal vaginal bleeding    P:  Discharge home in stable condition Follow up with HD as needed Return to MAU for emergencies.  Bleeding precautions.    Duane Lope, NP 02/04/2015 5:26 PM

## 2015-02-27 ENCOUNTER — Telehealth (HOSPITAL_COMMUNITY): Payer: Self-pay | Admitting: *Deleted

## 2015-02-27 NOTE — Telephone Encounter (Signed)
Attempted to call patient to schedule follow up appointment with [facility]. No one answered phone. Left voicemail for patient to call me back. 

## 2015-03-22 ENCOUNTER — Encounter: Payer: Self-pay | Admitting: Obstetrics and Gynecology

## 2015-03-25 ENCOUNTER — Other Ambulatory Visit: Payer: Self-pay | Admitting: *Deleted

## 2015-03-25 DIAGNOSIS — N760 Acute vaginitis: Principal | ICD-10-CM

## 2015-03-25 DIAGNOSIS — B9689 Other specified bacterial agents as the cause of diseases classified elsewhere: Secondary | ICD-10-CM

## 2015-03-25 MED ORDER — METRONIDAZOLE 500 MG PO TABS: 500.0000 mg | ORAL_TABLET | Freq: Two times a day (BID) | ORAL | Status: DC

## 2015-04-22 ENCOUNTER — Encounter: Payer: Self-pay | Admitting: Obstetrics and Gynecology

## 2015-04-23 ENCOUNTER — Other Ambulatory Visit: Payer: Self-pay | Admitting: *Deleted

## 2015-04-23 DIAGNOSIS — N898 Other specified noninflammatory disorders of vagina: Secondary | ICD-10-CM

## 2015-04-23 DIAGNOSIS — B373 Candidiasis of vulva and vagina: Secondary | ICD-10-CM

## 2015-04-23 DIAGNOSIS — B3731 Acute candidiasis of vulva and vagina: Secondary | ICD-10-CM

## 2015-04-23 MED ORDER — FLUCONAZOLE 150 MG PO TABS: 150.0000 mg | ORAL_TABLET | Freq: Once | ORAL | Status: DC

## 2015-05-30 ENCOUNTER — Other Ambulatory Visit: Payer: Self-pay | Admitting: Family Medicine

## 2015-08-08 ENCOUNTER — Other Ambulatory Visit: Payer: Self-pay | Admitting: Family Medicine

## 2015-10-02 ENCOUNTER — Telehealth (HOSPITAL_COMMUNITY): Payer: Self-pay | Admitting: *Deleted

## 2015-10-02 ENCOUNTER — Encounter (HOSPITAL_COMMUNITY): Payer: Self-pay | Admitting: *Deleted

## 2015-10-02 NOTE — Telephone Encounter (Signed)
Telephoned patient at home # and no answer. 

## 2016-01-01 ENCOUNTER — Other Ambulatory Visit: Payer: Self-pay | Admitting: Obstetrics & Gynecology

## 2016-01-01 DIAGNOSIS — N760 Acute vaginitis: Principal | ICD-10-CM

## 2016-01-01 DIAGNOSIS — B9689 Other specified bacterial agents as the cause of diseases classified elsewhere: Secondary | ICD-10-CM

## 2016-06-25 ENCOUNTER — Ambulatory Visit (INDEPENDENT_AMBULATORY_CARE_PROVIDER_SITE_OTHER): Payer: BLUE CROSS/BLUE SHIELD | Admitting: Physician Assistant

## 2016-06-25 VITALS — BP 116/71 | HR 72 | Temp 97.5°F | Resp 16 | Ht 70.0 in | Wt 153.0 lb

## 2016-06-25 DIAGNOSIS — T7840XA Allergy, unspecified, initial encounter: Secondary | ICD-10-CM | POA: Diagnosis not present

## 2016-06-25 MED ORDER — TRIAMCINOLONE ACETONIDE 0.1 % EX CREA
1.0000 | TOPICAL_CREAM | Freq: Two times a day (BID) | CUTANEOUS | 0 refills | Status: DC
Start: 2016-06-25 — End: 2021-01-05

## 2016-06-25 MED ORDER — CETIRIZINE HCL 10 MG PO TABS: 10.0000 mg | ORAL_TABLET | Freq: Every day | ORAL | 1 refills | Status: AC

## 2016-06-25 MED ORDER — HYDROXYZINE HCL 25 MG PO TABS: 12.5000 mg | ORAL_TABLET | Freq: Three times a day (TID) | ORAL | 0 refills | Status: AC | PRN

## 2016-06-25 NOTE — Patient Instructions (Addendum)
IF you received an x-ray today, you will receive an invoice from [facility]. Please contact [facility] at [phone] with questions or concerns regarding your invoice.   IF you received labwork today, you will receive an invoice from [facility]. Please contact [facility] at [phone] with questions or concerns regarding your invoice.   Our billing staff will not be able to assist you with questions regarding bills from these companies.  You will be contacted with the lab results as soon as they are available. The fastest way to get your results is to activate your My Chart account. Instructions are located on the last page of this paperwork. If you have not heard from [place] regarding the results in 2 weeks, please contact this office.    Wash your clothes with hypoalllergenic, and continue the same topical treatments. Contact Dermatitis Dermatitis is redness, soreness, and swelling (inflammation) of the skin. Contact dermatitis is a reaction to certain substances that touch the skin. You either touched something that irritated your skin, or you have allergies to something you touched. Follow these instructions at home: Skin Care   Moisturize your skin as needed.  Apply cool compresses to the affected areas.  Try taking a bath with:  Epsom salts. Follow the instructions on the package. You can get these at a pharmacy or grocery store.  Baking soda. Pour a small amount into the bath as told by your doctor.  Colloidal oatmeal. Follow the instructions on the package. You can get this at a pharmacy or grocery store.  Try applying baking soda paste to your skin. Stir water into baking soda until it looks like paste.  Do not scratch your skin.  Bathe less often.  Bathe in lukewarm water. Avoid using hot water. Medicines   Take or apply over-the-counter and prescription medicines only as told by your doctor.  If you were prescribed an antibiotic medicine, take  or apply your antibiotic as told by your doctor. Do not stop taking the antibiotic even if your condition starts to get better. General instructions   Keep all follow-up visits as told by your doctor. This is important.  Avoid the substance that caused your reaction. If you do not know what caused it, keep a journal to try to track what caused it. Write down:  What you eat.  What cosmetic products you use.  What you drink.  What you wear in the affected area. This includes jewelry.  If you were given a bandage (dressing), take care of it as told by your doctor. This includes when to change and remove it. Contact a doctor if:  You do not get better with treatment.  Your condition gets worse.  You have signs of infection such as:  Swelling.  Tenderness.  Redness.  Soreness.  Warmth.  You have a fever.  You have new symptoms. Get help right away if:  You have a very bad headache.  You have neck pain.  Your neck is stiff.  You throw up (vomit).  You feel very sleepy.  You see red streaks coming from the affected area.  Your bone or joint underneath the affected area becomes painful after the skin has healed.  The affected area turns darker.  You have trouble breathing. This information is not intended to replace advice given to you by your health care provider. Make sure you discuss any questions you have with your health care provider. Document Released: 12/28/2008 Document Revised: 08/08/2015 Document Reviewed: 07/18/2014 Elsevier  Education  2017 Elsevier Inc.  

## 2016-06-25 NOTE — Progress Notes (Signed)
PRIMARY CARE AT [facility] [address] [place] [address] [phone]  Date:  06/25/2016   Name:  Allison Briggs   DOB:  [birth year]   MRN:  [id number]  PCP:  No PCP Per Patient    History of Present Illness:  Allison Briggs is a 26 y.o. female patient who presents to PCP with  Chief Complaint  Patient presents with  . Allergic Reaction    Itchy, burning sensation, all over.     Itching and rash and spreading allover to legs, bottom and  Unscented soap.  The shower is not helping.  10-15 minutes later, she is scratching.   Cashier and stocking for about 5 years.   Yesterday, she did not touch anything, but started itching all over.  She took a bath after seeing red spots along her chest.   She is not cold with her jacket.  She notes more stressed or agitated with work. She has allergies to strongly scented, pollen, latex.     Patient Active Problem List   Diagnosis Date Noted  . Vaginal bleeding, abnormal 11/23/2014  . 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 Dysmenorrhea 10/20/2013  . Vaginal discharge 10/20/2013  . Screening for STD (sexually transmitted disease) 10/20/2013  . Dyspareunia 10/20/2013    Past Medical History:  Diagnosis Date  . Anemia   . BV (bacterial vaginosis)    09/15/13  . STD (sexually transmitted disease)    h/o chlamydia, RRP  . Trichimoniasis   . Vaginal Pap smear, abnormal     Past Surgical History:  Procedure Laterality Date  . CERVICAL BIOPSY  2016    Social History  Substance Use Topics  . Smoking status: Never Smoker  . Smokeless tobacco: Never Used  . Alcohol use No    Family History  Problem Relation Age of Onset  . Hypertension Maternal Grandmother   . Diabetes Maternal Grandmother   . Hypertension Paternal Grandmother   . Diabetes Other     great uncle maternal    Allergies  Allergen Reactions  . Latex Itching and Other (See Comments)    Reaction:  Redness     Medication list has been reviewed and  updated.  Current Outpatient Prescriptions on File Prior to Visit  Medication Sig Dispense Refill  . acetaminophen (TYLENOL) 500 MG tablet Take 1,500 mg by mouth every 6 (six) hours as needed for moderate pain.    . diphenhydrAMINE (BENADRYL) 25 mg capsule Take 25 mg by mouth every 6 (six) hours as needed for allergies.    . norgestimate-ethinyl estradiol (ORTHO-CYCLEN,SPRINTEC,PREVIFEM) 0.25-35 MG-MCG tablet Take 1 tablet by mouth daily. (Patient not taking: Reported on 06/25/2016) 1 Package 11   No current facility-administered medications on file prior to visit.     ROS ROS otherwise unremarkable unless listed above.  Physical Examination: BP 116/71   Pulse 72   Temp 97.5 F (36.4 C) (Oral)   Resp 16   Ht  (1.778 m)   Wt 153 lb (69.4 kg)   SpO2 99%   BMI 21.95 kg/m  Ideal Body Weight: Weight in (lb) to have BMI = 25: 173.9  Physical Exam  Constitutional: She is oriented to person, place, and time. She appears well-developed and well-nourished. No distress.  HENT:  Head: Normocephalic and atraumatic.  Right Ear: External ear normal.  Left Ear: External ear normal.  Eyes: Conjunctivae and EOM are normal. Pupils are equal, round, and reactive to light.  Cardiovascular: Normal rate.   Pulmonary/Chest:  Effort normal. No respiratory distress.  Neurological: She is alert and oriented to person, place, and time.  Skin: She is not diaphoretic.  Scant papules along the upper external arms.  No webbing space lesions, or at flexor regions.  Psychiatric: She has a normal mood and affect. Her behavior is normal.     Assessment and Plan: Allison Briggs is a 26 y.o. female who is here today  Diff dx: bed bugs, contact dermatitis, allergic dermatitis. rtc if no improvement within 1 week. Allergic reaction, initial encounter - Plan: hydrOXYzine (ATARAX/VISTARIL) 25 MG tablet, cetirizine (ZYRTEC) 10 MG tablet, triamcinolone cream (KENALOG) 0.1 %  Trena Platt, PA-C [facility] 4/16/20189:02 AM

## 2016-10-19 ENCOUNTER — Encounter (HOSPITAL_COMMUNITY): Payer: Self-pay | Admitting: Emergency Medicine

## 2016-10-19 ENCOUNTER — Ambulatory Visit (HOSPITAL_COMMUNITY)
Admission: EM | Admit: 2016-10-19 | Discharge: 2016-10-19 | Disposition: A | Discharge status: Home / Self Care | Payer: BLUE CROSS/BLUE SHIELD | Attending: Internal Medicine | Admitting: Internal Medicine

## 2016-10-19 DIAGNOSIS — Z8742 Personal history of other diseases of the female genital tract: Secondary | ICD-10-CM

## 2016-10-19 DIAGNOSIS — R0981 Nasal congestion: Secondary | ICD-10-CM

## 2016-10-19 DIAGNOSIS — R6884 Jaw pain: Secondary | ICD-10-CM

## 2016-10-19 MED ORDER — TRIAMCINOLONE ACETONIDE 55 MCG/ACT NA AERO: 2.0000 | INHALATION_SPRAY | Freq: Every day | NASAL | 0 refills | Status: AC

## 2016-10-19 MED ORDER — CETIRIZINE-PSEUDOEPHEDRINE ER 5-120 MG PO TB12: 1.0000 | ORAL_TABLET | Freq: Two times a day (BID) | ORAL | 0 refills | Status: AC

## 2016-10-19 NOTE — ED Triage Notes (Signed)
Ear and nose and throat pain for a week.  Reports having drainage in throat.  Patient also has a throbbing sensation

## 2016-10-19 NOTE — Discharge Instructions (Addendum)
No danger signs on exam today. Trial of Zyrtec-D and nasal steroid, to decrease congestion and postnasal drainage. Try eating yogurt every day to see if improvement in vaginitis symptoms occurs, may also help with long-standing sore throat. Might try a daily multivitamin as well.

## 2016-10-19 NOTE — ED Provider Notes (Signed)
[facility]    CSN: [id number] Arrival date & time: 10/19/16  1542     History   Chief Complaint Chief Complaint  Patient presents with  . URI    HPI Allison Briggs is a 26 y.o. female. She presents today with increased postnasal drainage and sinus congestion for about the last week, with some sore throat. The symptoms have increased again, with headache and malaise in the last couple of days. She has persistent throat symptoms, and is seeing ENT in the last month. Also describes some pain in her jaw. No fever, not achy, no nausea/vomiting, no diarrhea. She is not coughing. Also having difficulty with frequent episodes of bacterial vaginosis, mostly bothered by discharge and vaginal irritation. Wonders if there is something different than frequent rounds of Flagyl to try.  HPI  Past Medical History:  Diagnosis Date  . Anemia   . BV (bacterial vaginosis)    09/15/13  . STD (sexually transmitted disease)    h/o chlamydia, RRP  . Trichimoniasis   . Vaginal Pap smear, abnormal     Patient Active Problem List   Diagnosis Date Noted  . Vaginal bleeding, abnormal 11/23/2014  . 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 Dysmenorrhea 10/20/2013  . Vaginal discharge 10/20/2013  . Screening for STD (sexually transmitted disease) 10/20/2013  . Dyspareunia 10/20/2013    Past Surgical History:  Procedure Laterality Date  . CERVICAL BIOPSY  2016    OB History    Gravida Para Term Preterm AB Living   1 0 0 0 1 0   SAB TAB Ectopic Multiple Live Births   1 0 0 0         Home Medications    Prior to Admission medications   Medication Sig Start Date End Date Taking? Authorizing Provider  diphenhydrAMINE (BENADRYL) 25 mg capsule Take 25 mg by mouth every 6 (six) hours as needed for allergies.   Yes [provider]  acetaminophen (TYLENOL) 500 MG tablet Take 1,500 mg by mouth every 6 (six) hours as needed for moderate pain.    [provider]  cetirizine (ZYRTEC) 10 MG tablet Take 1 tablet (10 mg total) by mouth daily. 06/25/16   Trena Platt D, PA  cetirizine-pseudoephedrine (ZYRTEC-D) 5-120 MG tablet Take 1 tablet by mouth 2 (two) times daily. 10/19/16 11/03/16  Eustace Moore, MD  hydrOXYzine (ATARAX/VISTARIL) 25 MG tablet Take 0.5-1 tablets (12.5-25 mg total) by mouth every 8 (eight) hours as needed for itching. 06/25/16   Trena Platt D, PA  norgestimate-ethinyl estradiol (ORTHO-CYCLEN,SPRINTEC,PREVIFEM) 0.25-35 MG-MCG tablet Take 1 tablet by mouth daily. Patient not taking: Reported on 06/25/2016 12/27/14   Constant, Peggy, MD  triamcinolone (NASACORT) 55 MCG/ACT AERO nasal inhaler Place 2 sprays into the nose daily. 10/19/16   Eustace Moore, MD  triamcinolone cream (KENALOG) 0.1 % Apply 1 application topically 2 (two) times daily. 06/25/16   Garnetta Buddy, PA    Family History Family History  Problem Relation Age of Onset  . Hypertension Maternal Grandmother   . Diabetes Maternal Grandmother   . Hypertension Paternal Grandmother   . Diabetes Other        great uncle maternal    Social History Social History  Substance Use Topics  . Smoking status: Never Smoker  . Smokeless tobacco: Never Used  . Alcohol use No     Allergies   Latex   Review of Systems Review of Systems  All other systems reviewed and  are negative.    Physical Exam Triage Vital Signs ED Triage Vitals  Enc Vitals Group     BP 10/19/16 1705 120/71     Pulse Rate 10/19/16 1705 71     Resp 10/19/16 1705 16     Temp 10/19/16 1705 98.7 F (37.1 C)     Temp Source 10/19/16 1705 Oral     SpO2 10/19/16 1705 100 %     Weight --      Height --      Pain Score 10/19/16 1701 6     Pain Loc --    Updated Vital Signs BP 120/71 (BP Location: Right Arm)   Pulse 71   Temp 98.7 F (37.1 C) (Oral)   Resp 16   LMP 10/14/2016   SpO2 100%   Physical Exam  Constitutional: She is oriented to person, place, and  time. No distress.  HENT:  Head: Atraumatic.  Bilateral TMs are mildly dull, no erythema Minimal nasal congestion bilaterally Throat is very slightly injected, with some postnasal drainage evident Tonsils are slightly prominent, deep pink, no exudate  Eyes:  Conjugate gaze observed, no eye redness/discharge  Neck: Neck supple.  Cardiovascular: Normal rate and regular rhythm.   Pulmonary/Chest: No respiratory distress. She has no wheezes. She has no rales.  Lungs clear, symmetric breath sounds  Abdominal: She exhibits no distension.  Musculoskeletal: Normal range of motion.  Neurological: She is alert and oriented to person, place, and time.  Skin: Skin is warm and dry.  Nursing note and vitals reviewed.    UC Treatments / Results   Procedures Procedures (including critical care time) None today  Final Clinical Impressions(s) / UC Diagnoses   Final diagnoses:  Sinus congestion  History of vaginitis  Pain in lower jaw   No danger signs on exam today. Trial of Zyrtec-D and nasal steroid, to decrease congestion and postnasal drainage. Try eating yogurt every day to see if improvement in vaginitis symptoms occurs, may also help with long-standing sore throat. Might try a daily multivitamin as well.  New Prescriptions New Prescriptions   CETIRIZINE-PSEUDOEPHEDRINE (ZYRTEC-D) 5-120 MG TABLET    Take 1 tablet by mouth 2 (two) times daily.   TRIAMCINOLONE (NASACORT) 55 MCG/ACT AERO NASAL INHALER    Place 2 sprays into the nose daily.     Controlled Substance Prescriptions [place] Controlled Substance Registry consulted? Not Applicable   Eustace MooreMurray, Embry Huss W, MD 10/20/16 [phone]

## 2016-10-20 IMAGING — US US OB TRANSVAGINAL
1 series · 14 of 28 positions shown · non-contrast
Comparison: None.

CLINICAL DATA: 24-year-old pregnant female with left lower quadrant
pain and vaginal bleeding. Evaluate for ectopic pregnancy.



[Series 1: us ob comp less 14 wk · 14 of 85 slices shown]
[im 4/85]
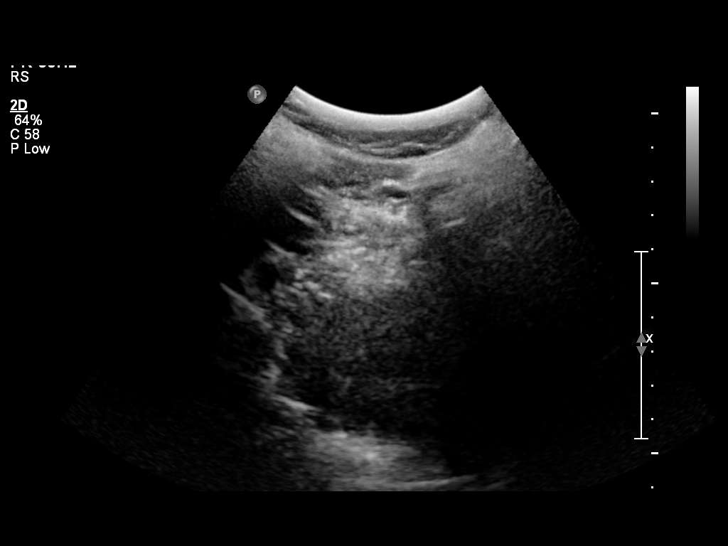
[im 10/85]
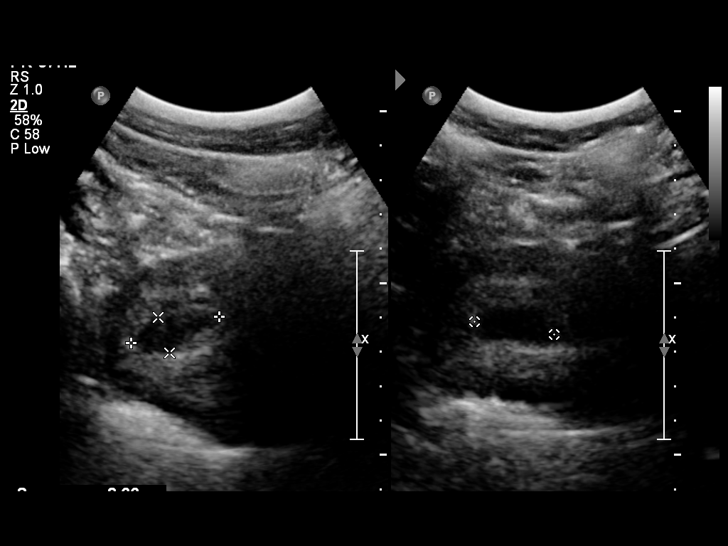
[im 16/85]
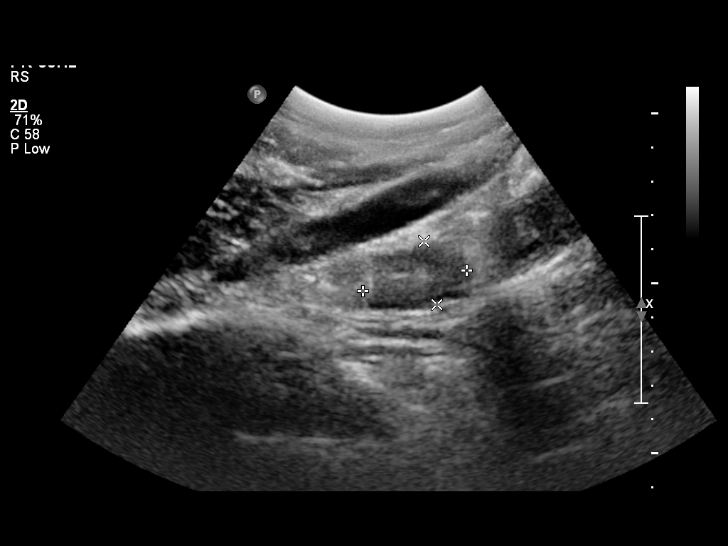
[im 22/85]
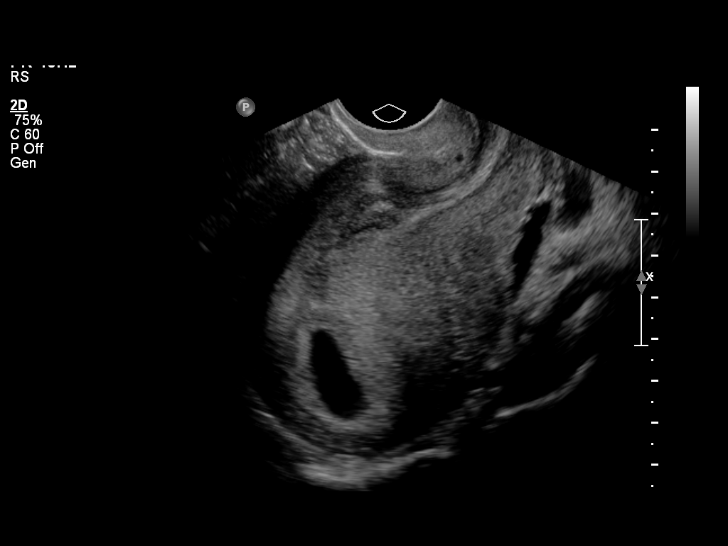
[im 29/85]
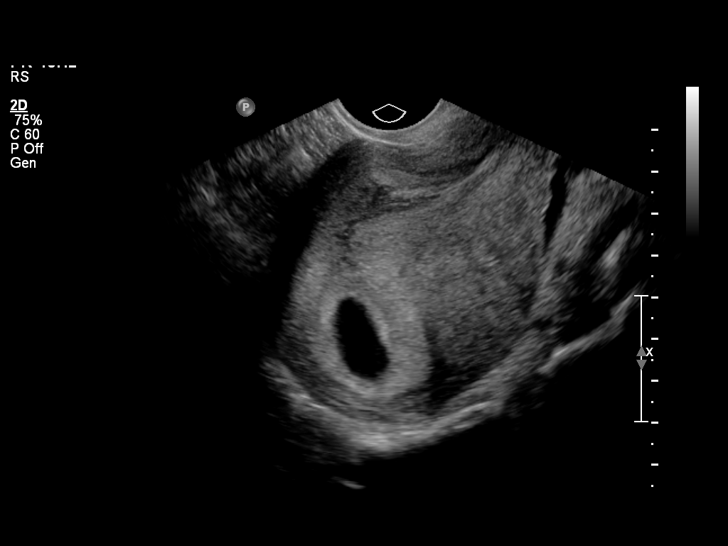
[im 35/85]
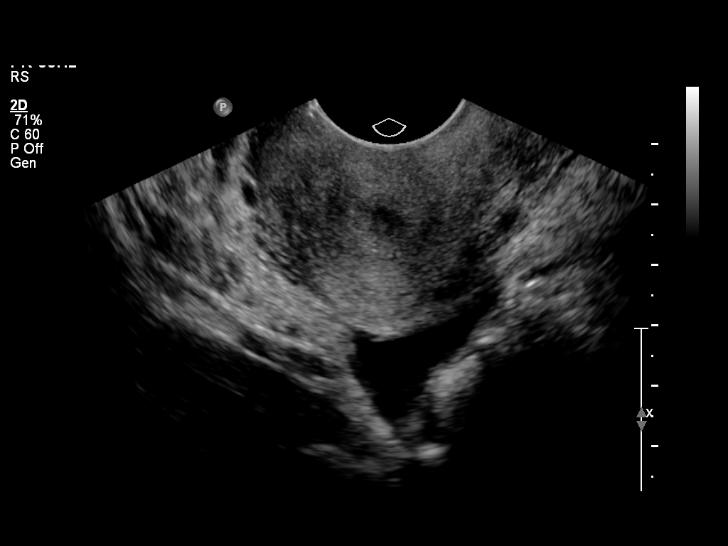
[im 41/85]
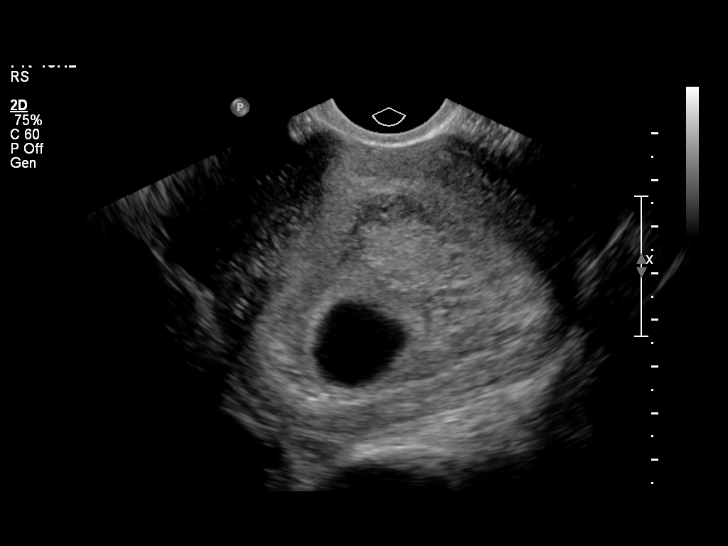
[im 47/85]
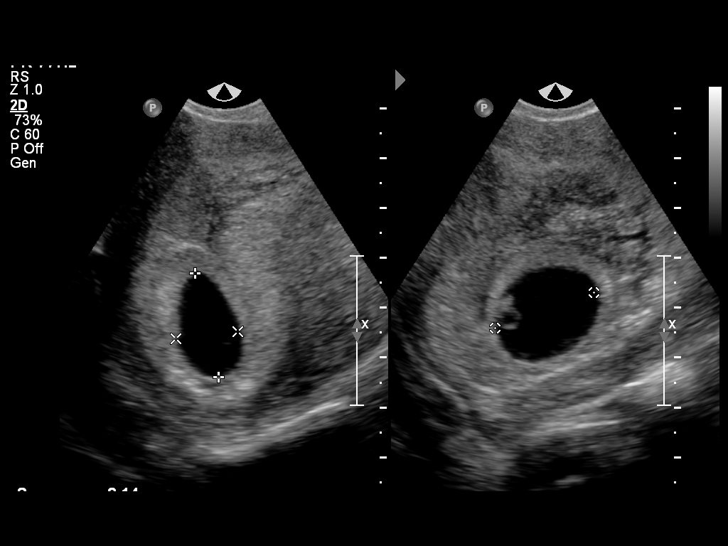
[im 53/85]
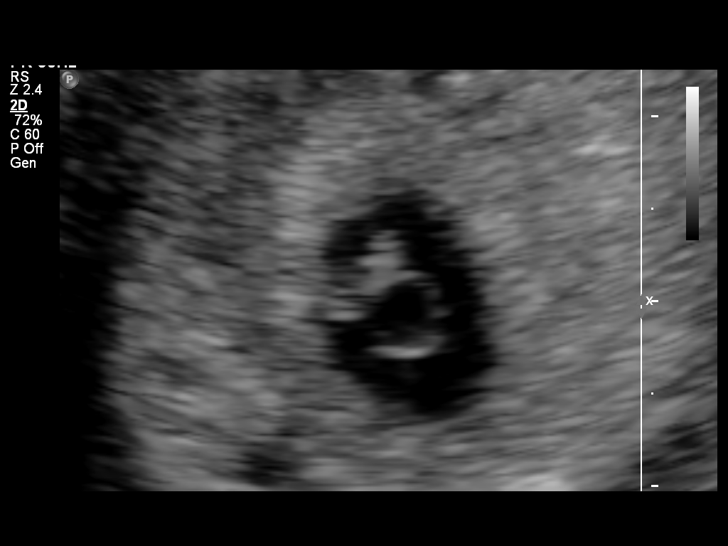
[im 60/85]
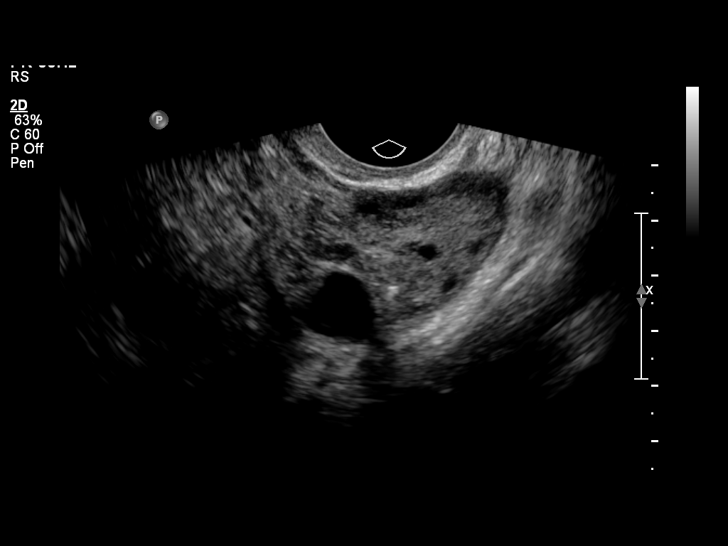
[im 66/85]
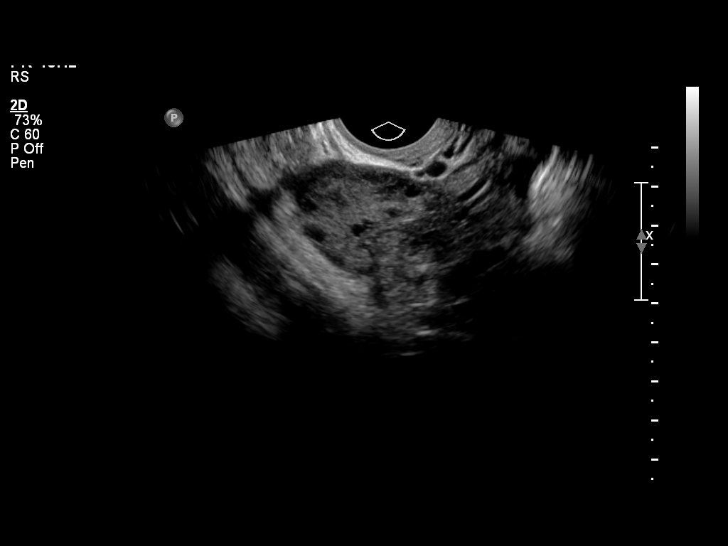
[im 72/85]
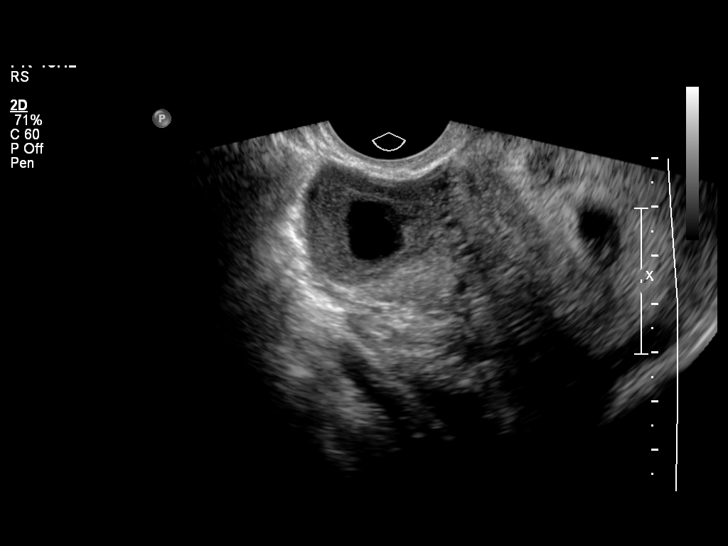
[im 78/85]
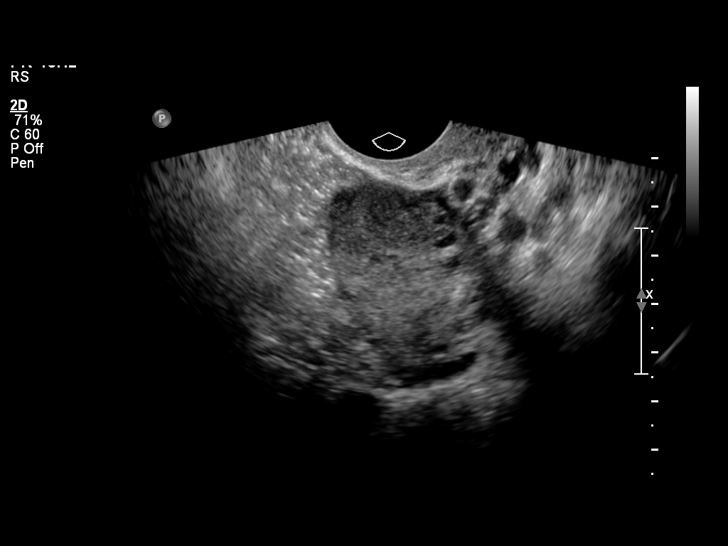
[im 85/85]
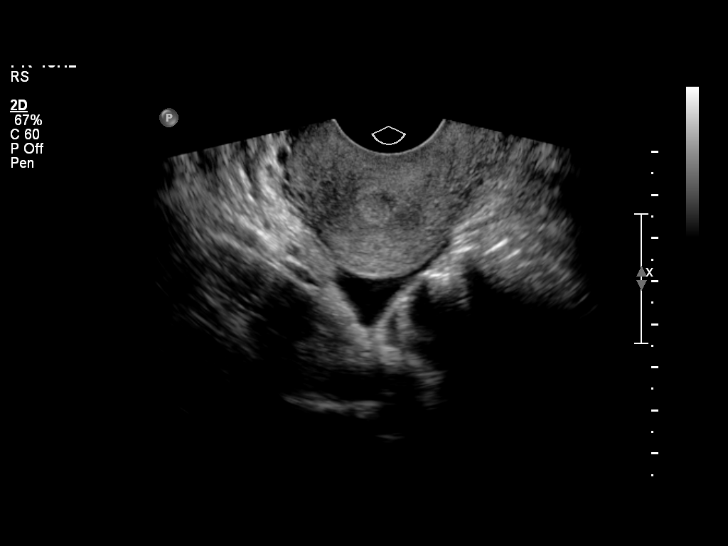

[14 of 28 positions shown; findings below may reference images not displayed]

FINDINGS: Intrauterine gestational sac: Visualized/normal in shape.

Yolk sac:  Present

Embryo:  Present

Cardiac Activity: Detected

Heart Rate: 114 bpm

CRL:   6  mm   6 w 3 d                  US EDC: 05/05/2015

Maternal uterus/adnexae: The ovaries are unremarkable. Right ovarian
corpus luteum noted.

Doppler images demonstrate flow to both ovaries. Small free fluid
within the pelvis.
IMPRESSION: Single live intrauterine pregnancy with estimated gestational age of
6 weeks, 3 days and a cardiac activity of 115 beats per min.

## 2016-11-08 IMAGING — US US OB TRANSVAGINAL
1 series · 14 of 28 positions shown · non-contrast
Comparison: 09/12/2014

CLINICAL DATA: Pelvic/abdominal cramping. Past blood clot per
vagina last evening. Patient 8 weeks and 3 days pregnant based on
her last menstrual period.

EXAM:
TRANSVAGINAL OB ULTRASOUND
TECHNIQUE: Transvaginal ultrasound was performed for complete evaluation of the
gestation as well as the maternal uterus, adnexal regions, and
pelvic cul-de-sac.

[Series 1: us ob transvaginal · 30 acquisitions, 14 frames shown]
[im 2/30]
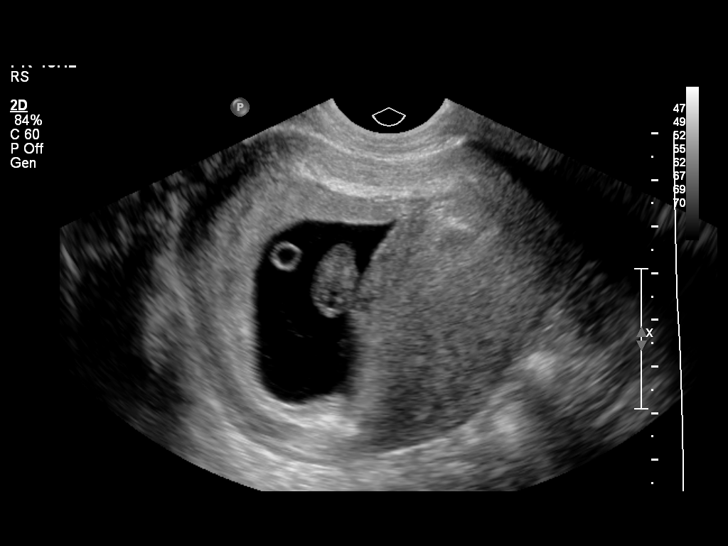
[im 4/30]
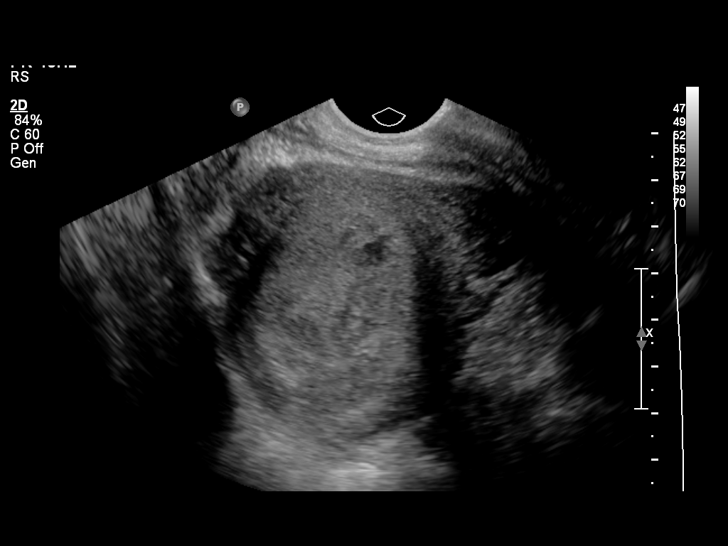
[im 6/30]
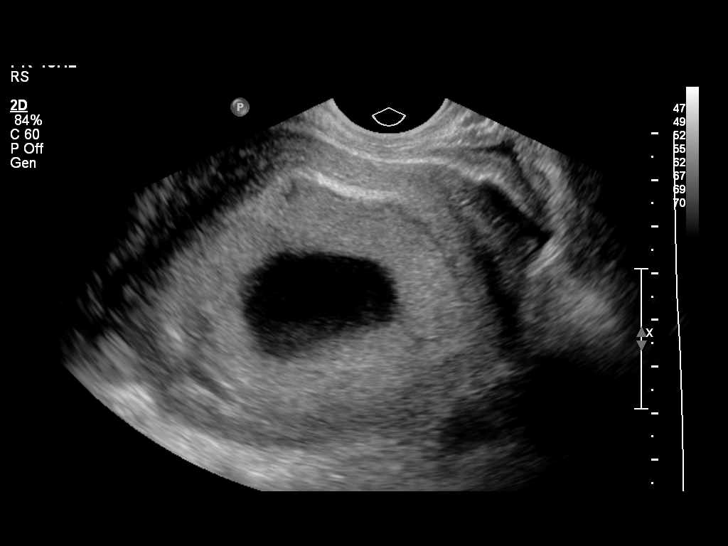
[im 8/30]
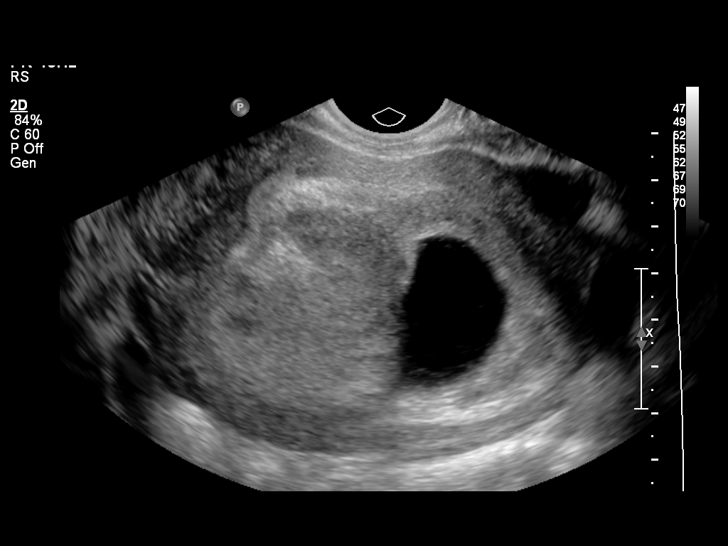
[im 10/30]
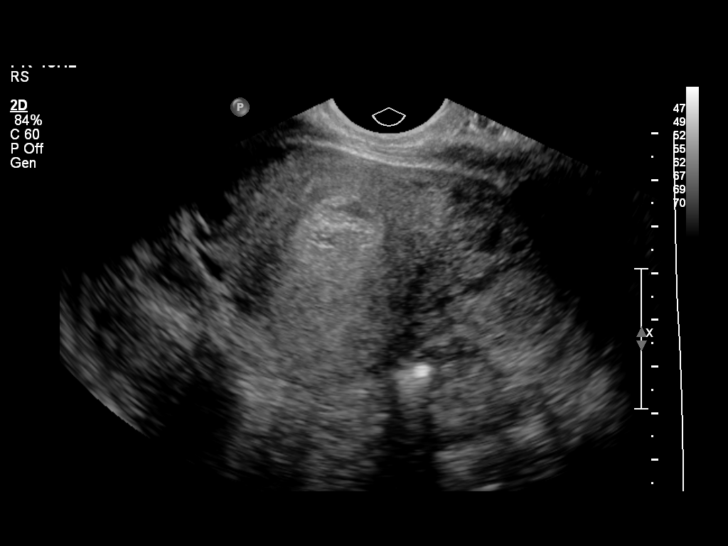
[im 12/30]
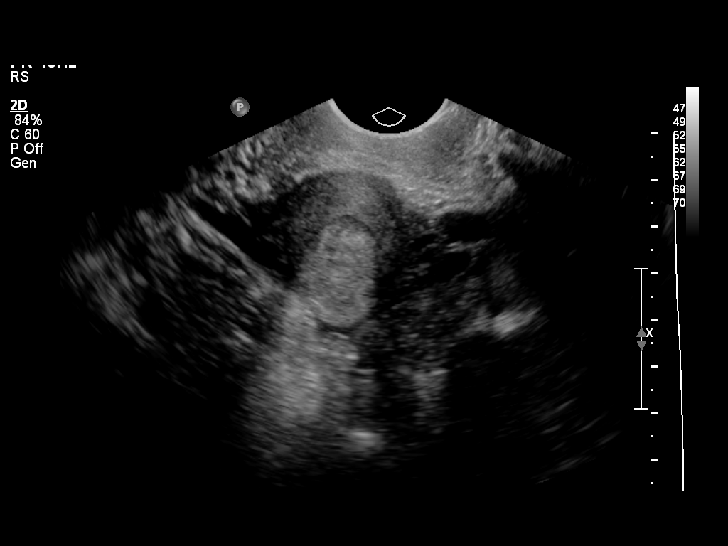
[im 14/30]
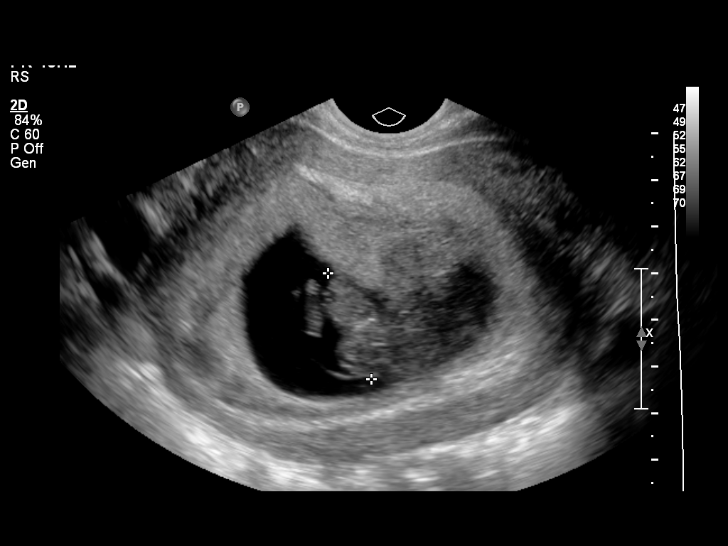
[im 17/30]
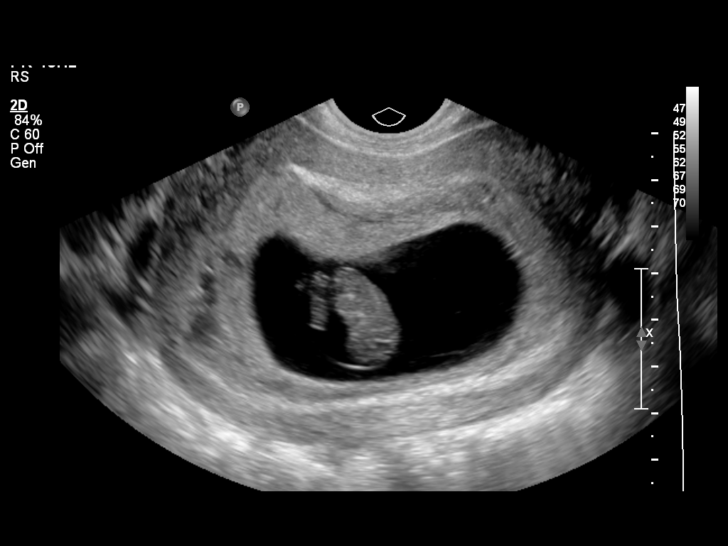
[im 19/30]
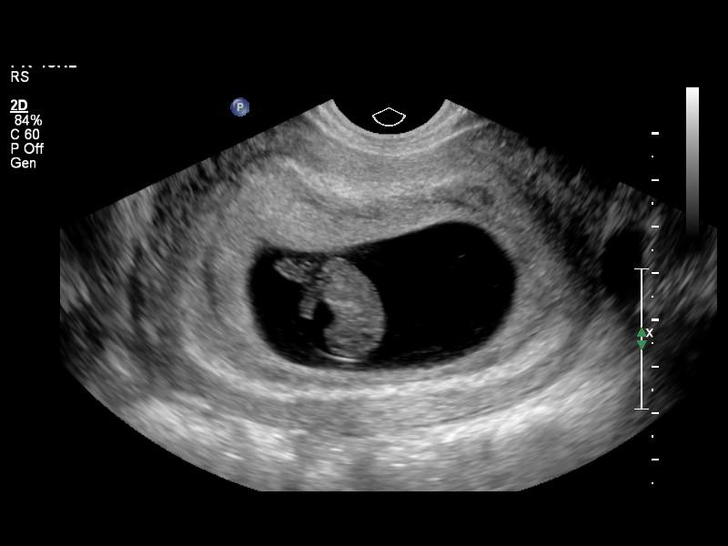
[im 21/30]
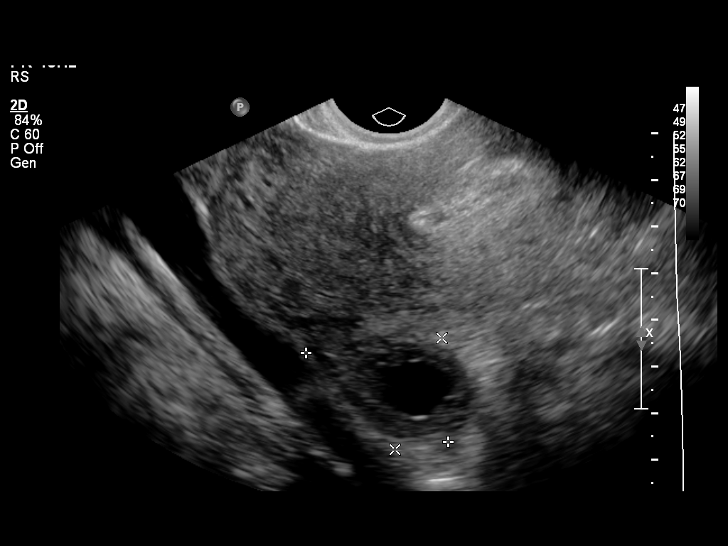
[im 23/30]
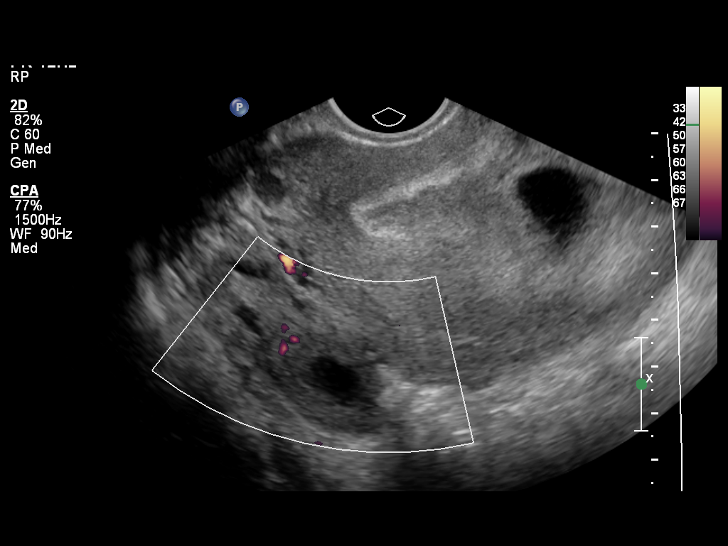
[im 25/30]
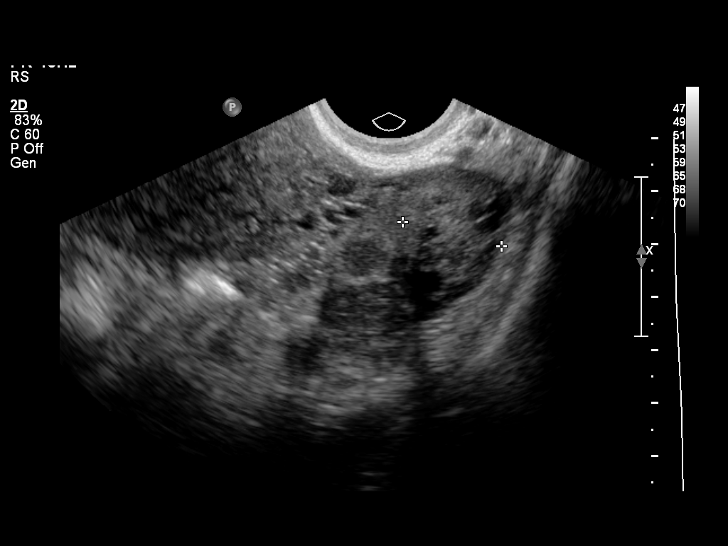
[im 27/30]
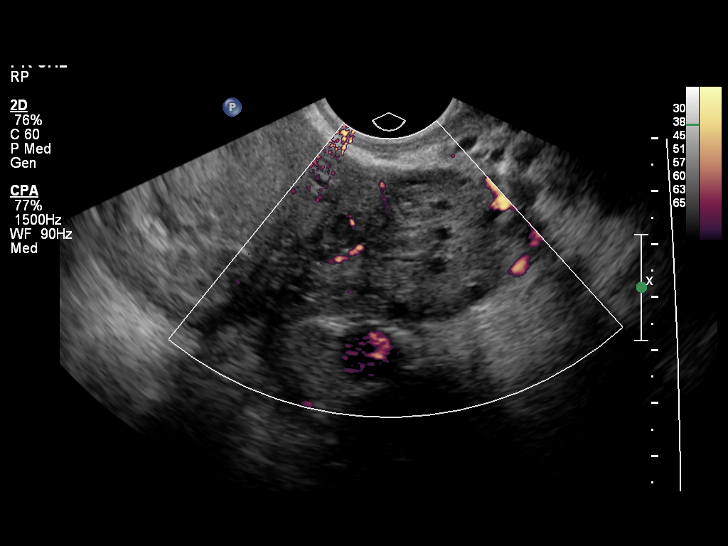
[im 30/30]
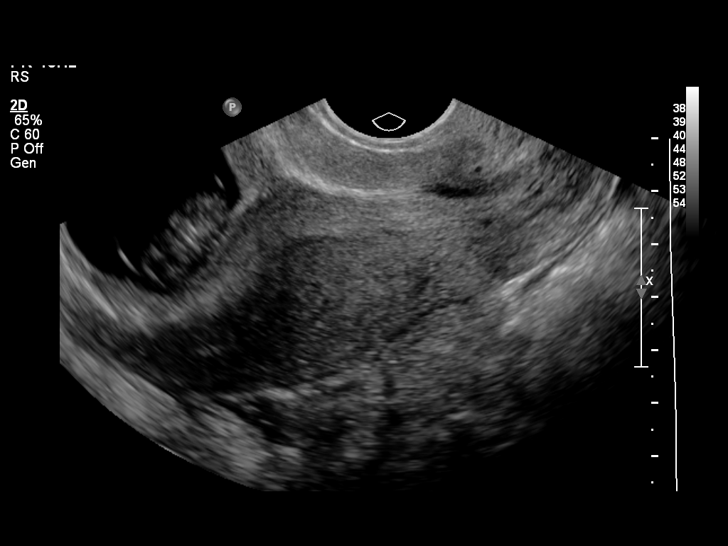

[14 of 28 positions shown; findings below may reference images not displayed]

FINDINGS: Intrauterine gestational sac: Visualized/normal in shape.

Yolk sac:  Yes

Embryo:  Yes

Cardiac Activity: Yes

Heart Rate: 172 bpm

CRL:   23.7  mm   9 w 1 d                  US EDC: 05/05/2015

Maternal uterus/adnexae: No uterine masses. No subchorionic
hemorrhage. Cervix is closed. Ovaries unremarkable. Corpus luteum
arises from the right ovary. No pelvic free fluid. No adnexal
masses.
IMPRESSION: Single live intrauterine pregnancy with a measured gestational age
of 9 weeks 1 day, showing normal interval growth from the recent
prior obstetrical ultrasound. No emergent complication.

## 2017-04-14 ENCOUNTER — Other Ambulatory Visit: Payer: Self-pay

## 2017-04-14 ENCOUNTER — Ambulatory Visit: Payer: BLUE CROSS/BLUE SHIELD | Admitting: Family Medicine

## 2017-04-14 ENCOUNTER — Encounter: Payer: Self-pay | Admitting: Family Medicine

## 2017-04-14 VITALS — BP 95/62 | HR 90 | Temp 98.6°F | Resp 16 | Ht 70.0 in | Wt 151.0 lb

## 2017-04-14 DIAGNOSIS — B9689 Other specified bacterial agents as the cause of diseases classified elsewhere: Secondary | ICD-10-CM | POA: Diagnosis not present

## 2017-04-14 DIAGNOSIS — B977 Papillomavirus as the cause of diseases classified elsewhere: Secondary | ICD-10-CM

## 2017-04-14 DIAGNOSIS — Z0000 Encounter for general adult medical examination without abnormal findings: Secondary | ICD-10-CM

## 2017-04-14 DIAGNOSIS — N760 Acute vaginitis: Secondary | ICD-10-CM

## 2017-04-14 DIAGNOSIS — Z113 Encounter for screening for infections with a predominantly sexual mode of transmission: Secondary | ICD-10-CM | POA: Diagnosis not present

## 2017-04-14 DIAGNOSIS — R87612 Low grade squamous intraepithelial lesion on cytologic smear of cervix (LGSIL): Secondary | ICD-10-CM

## 2017-04-14 DIAGNOSIS — Z30016 Encounter for initial prescription of transdermal patch hormonal contraceptive device: Secondary | ICD-10-CM | POA: Diagnosis not present

## 2017-04-14 LAB — POCT URINE PREGNANCY: Preg Test, Ur: NEGATIVE

## 2017-04-14 MED ORDER — CLINDAMYCIN HCL 300 MG PO CAPS
300.0000 mg | ORAL_CAPSULE | Freq: Three times a day (TID) | ORAL | 0 refills | Status: AC
Start: 2017-04-14 — End: 2017-04-21

## 2017-04-14 MED ORDER — NORELGESTROMIN-ETH ESTRADIOL 150-35 MCG/24HR TD PTWK: 1.0000 | MEDICATED_PATCH | TRANSDERMAL | 12 refills | Status: AC

## 2017-04-14 NOTE — Progress Notes (Deleted)
  No chief complaint on file.   HPI  4 review of systems  Past Medical History:  Diagnosis Date  . Anemia   . BV (bacterial vaginosis)    09/15/13  . STD (sexually transmitted disease)    h/o chlamydia, RRP  . Trichimoniasis   . Vaginal Pap smear, abnormal     Current Outpatient Medications  Medication Sig Dispense Refill  . acetaminophen (TYLENOL) 500 MG tablet Take 1,500 mg by mouth every 6 (six) hours as needed for moderate pain.    . cetirizine (ZYRTEC) 10 MG tablet Take 1 tablet (10 mg total) by mouth daily. 30 tablet 1  . diphenhydrAMINE (BENADRYL) 25 mg capsule Take 25 mg by mouth every 6 (six) hours as needed for allergies.    . hydrOXYzine (ATARAX/VISTARIL) 25 MG tablet Take 0.5-1 tablets (12.5-25 mg total) by mouth every 8 (eight) hours as needed for itching. 30 tablet 0  . norgestimate-ethinyl estradiol (ORTHO-CYCLEN,SPRINTEC,PREVIFEM) 0.25-35 MG-MCG tablet Take 1 tablet by mouth daily. (Patient not taking: Reported on 06/25/2016) 1 Package 11  . triamcinolone (NASACORT) 55 MCG/ACT AERO nasal inhaler Place 2 sprays into the nose daily. 1 Inhaler 0  . triamcinolone cream (KENALOG) 0.1 % Apply 1 application topically 2 (two) times daily. 30 g 0   No current facility-administered medications for this visit.     Allergies:  Allergies  Allergen Reactions  . Latex Itching and Other (See Comments)    Reaction:  Redness     Past Surgical History:  Procedure Laterality Date  . CERVICAL BIOPSY  2016    Social History   Socioeconomic History  . Marital status: Single    Spouse name: Not on file  . Number of children: Not on file  . Years of education: Not on file  . Highest education level: Not on file  Social Needs  . Financial resource strain: Not on file  . Food insecurity - worry: Not on file  . Food insecurity - inability: Not on file  . Transportation needs - medical: Not on file  . Transportation needs - non-medical: Not on file  Occupational History  .  Not on file  Tobacco Use  . Smoking status: Never Smoker  . Smokeless tobacco: Never Used  Substance and Sexual Activity  . Alcohol use: No    Alcohol/week: 0.0 oz  . Drug use: Yes    Types: Marijuana  . Sexual activity: Yes    Birth control/protection: Pill  Other Topics Concern  . Not on file  Social History Narrative  . Not on file    Family History  Problem Relation Age of Onset  . Hypertension Maternal Grandmother   . Diabetes Maternal Grandmother   . Hypertension Paternal Grandmother   . Diabetes Other        great uncle maternal     ROS Review of Systems See HPI Constitution: No fevers or chills No malaise No diaphoresis Skin: No rash or itching Eyes: no blurry vision, no double vision GU: no dysuria or hematuria Neuro: no dizziness or headaches * all others reviewed and negative   Objective: There were no vitals filed for this visit.  Physical Exam  Assessment and Plan There are no diagnoses linked to this encounter.   Delores P [employer]

## 2017-04-14 NOTE — Patient Instructions (Addendum)
nexplanon  Check out [email or web address]  For your patch information then go to [email or web address]   Antibiotic - You have received the antibiotic Clindamycin. It may take several days before you feel any benefit from this medicine. Be sure to take all the medication prescribed, even if you are feeling better before it is finished. Do not drink alcohol while taking antibiotics.  It is unknown whether you will develop an adverse reaction (diarrhea, rash, nausea or vomiting) or an allergic reaction (trouble breathing, anaphylaxis).    For Females on birth control medications: Please use back up birth control (condoms) as some antibiotics can decrease the effectiveness of birth control and increase the chance of pregnancy.     IF you received an x-ray today, you will [facility] an [facility]. Please contact [facility] at [phone] with questions or concerns regarding your invoice.   IF you received labwork today, you will receive an invoice from [facility]. Please contact [facility] at [phone] with questions or concerns regarding your invoice.   Our billing staff will not be able to assist you with questions regarding bills from these companies.  You will be contacted with the lab results as soon as they are available. The fastest way to get your results is to activate your My Chart account. Instructions are located on the last page of this paperwork. If you have not heard from us regarding the results in 2 weeks, please contact this office.     Bacterial Vaginosis Bacterial vaginosis is a vaginal infection that occurs when the normal balance of bacteria in the vagina is disrupted. It results from an overgrowth of certain bacteria. This is the most common vaginal infection among women ages 415-44. Because bacterial vaginosis increases your risk for STIs (sexually transmitted infections), getting treated can help reduce your risk for chlamydia, gonorrhea, herpes, and HIV  (human immunodeficiency virus). Treatment is also important for preventing complications in pregnant women, because this condition can cause an early (premature) delivery. What are the causes? This condition is caused by an increase in harmful bacteria that are normally present in small amounts in the vagina. However, the reason that the condition develops is not fully understood. What increases the risk? The following factors may make you more likely to develop this condition:  Having a new sexual partner or multiple sexual partners.  Having unprotected sex.  Douching.  Having an intrauterine device (IUD).  Smoking.  Drug and alcohol abuse.  Taking certain antibiotic medicines.  Being pregnant.  You cannot get bacterial vaginosis from toilet seats, bedding, swimming pools, or contact with objects around you. What are the signs or symptoms? Symptoms of this condition include:  Grey or white vaginal discharge. The discharge can also be watery or foamy.  A fish-like odor with discharge, especially after sexual intercourse or during menstruation.  Itching in and around the vagina.  Burning or pain with urination.  Some women with bacterial vaginosis have no signs or symptoms. How is this diagnosed? This condition is diagnosed based on:  Your medical history.  A physical exam of the vagina.  Testing a sample of vaginal fluid under a microscope to look for a large amount of bad bacteria or abnormal cells. Your health care provider may use a cotton swab or a small wooden spatula to collect the sample.  How is this treated? This condition is treated with antibiotics. These may be given as a pill, a vaginal cream, or a medicine that is put into the vagina (suppository).  If the condition comes back after treatment, a second round of antibiotics may be needed. Follow these instructions at home: Medicines  Take over-the-counter and prescription medicines only as told by your  health care provider.  Take or use your antibiotic as told by your health care provider. Do not stop taking or using the antibiotic even if you start to feel better. General instructions  If you have a female sexual partner, tell her that you have a vaginal infection. She should see her health care provider and be treated if she has symptoms. If you have a female sexual partner, he does not need treatment.  During treatment: ? Avoid sexual activity until you finish treatment. ? Do not douche. ? Avoid alcohol as directed by your health care provider. ? Avoid breastfeeding as directed by your health care provider.  Drink enough water and fluids to keep your urine clear or pale yellow.  Keep the area around your vagina and rectum clean. ? Wash the area daily with warm water. ? Wipe yourself from front to back after using the toilet.  Keep all follow-up visits as told by your health care provider. This is important. How is this prevented?  Do not douche.  Wash the outside of your vagina with warm water only.  Use protection when having sex. This includes latex condoms and dental dams.  Limit how many sexual partners you have. To help prevent bacterial vaginosis, it is best to have sex with just one partner (monogamous).  Make sure you and your sexual partner are tested for STIs.  Wear cotton or cotton-lined underwear.  Avoid wearing tight pants and pantyhose, especially during summer.  Limit the amount of alcohol that you drink.  Do not use any products that contain nicotine or tobacco, such as cigarettes and e-cigarettes. If you need help quitting, ask your health care provider.  Do not use illegal drugs. Where to find more information:  Centers for Disease Control and Prevention: SolutionApps.co.za  American Sexual Health Association (ASHA): www.ashastd.org  [place] Department of Health and Health and safety inspector, Office on Women's Health: ConventionalMedicines.si or  http://www.anderson-williamson.info/ Contact a health care provider if:  Your symptoms do not improve, even after treatment.  You have more discharge or pain when urinating.  You have a fever.  You have pain in your abdomen.  You have pain during sex.  You have vaginal bleeding between periods. Summary  Bacterial vaginosis is a vaginal infection that occurs when the normal balance of bacteria in the vagina is disrupted.  Because bacterial vaginosis increases your risk for STIs (sexually transmitted infections), getting treated can help reduce your risk for chlamydia, gonorrhea, herpes, and HIV (human immunodeficiency virus). Treatment is also important for preventing complications in pregnant women, because the condition can cause an early (premature) delivery.  This condition is treated with antibiotic medicines. These may be given as a pill, a vaginal cream, or a medicine that is put into the vagina (suppository). This information is not intended to replace advice given to you by your health care provider. Make sure you discuss any questions you have with your health care provider. Document Released: 03/02/2005 Document Revised: 07/06/2016 Document Reviewed: 11/16/2015 Elsevier Interactive Patient Education  [employer].

## 2017-04-14 NOTE — Progress Notes (Signed)
Chief Complaint  Patient presents with  . Annual Exam    with pap, urine colllected    Subjective:  Allison Briggs is a 27 y.o. female here for a health maintenance visit.  Patient is established pt   Contraception She is a G1P0010 with an unplanned pregnancy that ended in SAB in 2016 She has not been on ocps since then She was taking her ocps sporadically so does not want to try that again She is sexually active Patient's last menstrual period was 04/05/2017. She denies uncontrolled migraines, depression, liver disease, blood clots or hypertension  Patient Active Problem List   Diagnosis Date Noted  . Vaginal bleeding, abnormal 11/23/2014  . 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 Dysmenorrhea 10/20/2013  . Vaginal discharge 10/20/2013  . Screening for STD (sexually transmitted disease) 10/20/2013  . Dyspareunia 10/20/2013    Past Medical History:  Diagnosis Date  . Anemia   . BV (bacterial vaginosis)    09/15/13  . STD (sexually transmitted disease)    h/o chlamydia, RRP  . Trichimoniasis   . Vaginal Pap smear, abnormal     Past Surgical History:  Procedure Laterality Date  . CERVICAL BIOPSY  2016     Outpatient Medications Prior to Visit  Medication Sig Dispense Refill  . diphenhydrAMINE (BENADRYL) 25 mg capsule Take 25 mg by mouth every 6 (six) hours as needed for allergies.    Marland Kitchen acetaminophen (TYLENOL) 500 MG tablet Take 1,500 mg by mouth every 6 (six) hours as needed for moderate pain.    . cetirizine (ZYRTEC) 10 MG tablet Take 1 tablet (10 mg total) by mouth daily. (Patient not taking: Reported on 04/14/2017) 30 tablet 1  . hydrOXYzine (ATARAX/VISTARIL) 25 MG tablet Take 0.5-1 tablets (12.5-25 mg total) by mouth every 8 (eight) hours as needed for itching. (Patient not taking: Reported on 04/14/2017) 30 tablet 0  . norgestimate-ethinyl estradiol (ORTHO-CYCLEN,SPRINTEC,PREVIFEM) 0.25-35 MG-MCG tablet Take 1 tablet by mouth daily. (Patient not  taking: Reported on 06/25/2016) 1 Package 11  . triamcinolone (NASACORT) 55 MCG/ACT AERO nasal inhaler Place 2 sprays into the nose daily. (Patient not taking: Reported on 04/14/2017) 1 Inhaler 0  . triamcinolone cream (KENALOG) 0.1 % Apply 1 application topically 2 (two) times daily. (Patient not taking: Reported on 04/14/2017) 30 g 0   No facility-administered medications prior to visit.     Allergies  Allergen Reactions  . Latex Itching and Other (See Comments)    Reaction:  Redness      Family History  Problem Relation Age of Onset  . Hypertension Maternal Grandmother   . Diabetes Maternal Grandmother   . Hypertension Paternal Grandmother   . Diabetes Other        great uncle maternal     Health Habits: Dental Exam: up to date Eye Exam: up to date Exercise: 0 times/week on average Current exercise activities: walking/running Diet: balanced  Social History   Socioeconomic History  . Marital status: Single    Spouse name: Not on file  . Number of children: Not on file  . Years of education: Not on file  . Highest education level: Not on file  Social Needs  . Financial resource strain: Not on file  . Food insecurity - worry: Not on file  . Food insecurity - inability: Not on file  . Transportation needs - medical: Not on file  . Transportation needs - non-medical: Not on file  Occupational History  . Not on file  Tobacco Use  .  Smoking status: Never Smoker  . Smokeless tobacco: Never Used  Substance and Sexual Activity  . Alcohol use: No    Alcohol/week: 0.0 oz  . Drug use: Yes    Types: Marijuana  . Sexual activity: Yes    Birth control/protection: Pill  Other Topics Concern  . Not on file  Social History Narrative  . Not on file   Social History   Substance and Sexual Activity  Alcohol Use No  . Alcohol/week: 0.0 oz   Social History   Tobacco Use  Smoking Status Never Smoker  Smokeless Tobacco Never Used   Social History   Substance and  Sexual Activity  Drug Use Yes  . Types: Marijuana    GYN: Sexual Health Menstrual status: regular menses LMP: Patient's last menstrual period was 04/05/2017. Last pap smear: see HM section History of abnormal pap smears:  Sexually active: with female partner Current contraception: coitus interruptus   Health Maintenance: See under health Maintenance activity for review of completion dates as well.  There is no immunization history on file for this patient.    Depression Screen-PHQ2/9 Depression screen [facility] 2/9 04/14/2017 06/25/2016  Decreased Interest 0 0  Down, Depressed, Hopeless 0 0  PHQ - 2 Score 0 0      Depression Severity and Treatment Recommendations:  0-4= None  5-9= Mild / Treatment: Support, educate to call if worse; return in one month  10-14= Moderate / Treatment: Support, watchful waiting; Antidepressant or Psycotherapy  15-19= Moderately severe / Treatment: Antidepressant OR Psychotherapy  >= 20 = Major depression, severe / Antidepressant AND Psychotherapy    Review of Systems   Review of Systems  Constitutional: Negative for chills and fever.  HENT: Negative for hearing loss and tinnitus.   Eyes: Negative for blurred vision and double vision.  Respiratory: Negative for cough and shortness of breath.   Cardiovascular: Negative for chest pain and palpitations.  Gastrointestinal: Negative for heartburn, nausea and vomiting.  Genitourinary: Negative for dysuria and urgency.  Musculoskeletal: Negative for myalgias and neck pain.  Skin: Negative for itching and rash.  Neurological: Negative for dizziness and headaches.  Psychiatric/Behavioral: Negative for depression. The patient is not nervous/anxious.     See HPI for ROS as well.    Objective:   Vitals:   04/14/17 1634  BP: 95/62  Pulse: 90  Resp: 16  Temp: 98.6 F (37 C)  TempSrc: Oral  SpO2: 96%  Weight: 151 lb (68.5 kg)  Height: 5\' 10"  (1.778 m)    Body mass index is 21.67  kg/m.  Physical Exam  Constitutional: She is oriented to person, place, and time. She appears well-developed and well-nourished.  HENT:  Head: Normocephalic and atraumatic.  Right Ear: External ear normal.  Left Ear: External ear normal.  Eyes: Conjunctivae and EOM are normal.  Neck: Normal range of motion. Neck supple. No thyromegaly present.  Cardiovascular: Normal rate, regular rhythm and normal heart sounds.  No murmur heard. Pulmonary/Chest: Effort normal and breath sounds normal. No respiratory distress. She has no wheezes.  Abdominal: Soft. Bowel sounds are normal. She exhibits no distension and no mass. There is no tenderness. There is no rebound and no guarding.  Musculoskeletal: Normal range of motion. She exhibits no edema.  Neurological: She is alert and oriented to person, place, and time. She has normal reflexes. No cranial nerve deficit.  Skin: Skin is warm. No erythema.  Psychiatric: She has a normal mood and affect. Her behavior is normal. Judgment and thought  content normal.   Vaginal exam- chaperone present Labia normal bilaterally without skin lesions Urethral meatus normal appearing without erythema Vagina with white, thin discharge No CMT, ovaries small and not palpable Uterus midline, non-tender Pap smear performed     Assessment/Plan:   Patient was seen for a health maintenance exam.  Counseled the patient on health maintenance issues. Reviewed her health mainteance schedule and ordered appropriate tests (see orders.) Counseled on regular exercise and weight management. Recommend regular eye exams and dental cleaning.   The following issues were addressed today for health maintenance:   Allison Briggs was seen today for annual exam.  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  Encounter for initial prescription of transdermal patch hormonal contraceptive device- Education given regarding options for contraception, including barrier methods, injectable contraception, IUD placement, oral contraceptives.  -     POCT urine pregnancy  Encounter for health maintenance examination in adult-  Reviewed age appropriate screenings  Other orders -     Cancel: Pap IG, CT/NG NAA, and HPV (high risk) [facility] -     Cancel: Flu Vaccine QUAD 36+ mos IM -     Cancel: Tdap vaccine greater than or equal to 26yo IM -     norelgestromin-ethinyl estradiol (ORTHO EVRA) 150-35 MCG/24HR transdermal patch; Place 1 patch onto the skin once a week.    No Follow-up on file.    Body mass index is 21.67 kg/m.:  Discussed the patient's BMI with patient. The BMI body mass index is 21.67 kg/m.     No future appointments.  Patient Instructions   [email or web address]  Check out [email or web address]  For your patch information then go to [email or web address]   Antibiotic - You have received the antibiotic Clindamycin. It may take several days before you feel any benefit from this medicine. Be sure to take all the medication prescribed, even if you are feeling better before it is finished. Do not drink alcohol while taking antibiotics.  It is unknown whether you will develop an adverse reaction (diarrhea, rash, nausea or vomiting) or an allergic reaction (trouble breathing, anaphylaxis).    For Females on birth control medications: Please use back up birth control (condoms) as some antibiotics can decrease the effectiveness of birth control and increase the chance of pregnancy.     IF you received an x-ray today, you will  receive an invoice from [facility]. Please contact [facility] at [phone] with questions or concerns regarding your invoice.   IF you received labwork today, you will receive an invoice from [facility]. Please contact [facility] at [phone] with questions or concerns regarding your invoice.   Our billing staff will not be able to assist you with questions regarding bills from these companies.  You will be contacted with the lab results as soon as they are available. The fastest way to get your results is to activate your My Chart account. Instructions are located on the last page of this paperwork. If you  have not heard from us regarding the results in 2 weeks, please contact this office.     Bacterial Vaginosis Bacterial vaginosis is a vaginal infection that occurs when the normal balance of bacteria in the vagina is disrupted. It results from an overgrowth of certain bacteria. This is the most common vaginal infection among women ages 5715-44. Because bacterial vaginosis increases your risk for STIs (sexually transmitted infections), getting treated can help reduce your risk for chlamydia, gonorrhea, herpes, and HIV (human immunodeficiency virus). Treatment is also important for preventing complications in pregnant women, because this condition can cause an early (premature) delivery. What are the causes? This condition is caused by an increase in harmful bacteria that are normally present in small amounts in the vagina. However, the reason that the condition develops is not fully understood. What increases the risk? The following factors may make you more likely to develop this condition:  Having a new sexual partner or multiple sexual partners.  Having unprotected sex.  Douching.  Having an intrauterine device (IUD).  Smoking.  Drug and alcohol abuse.  Taking certain antibiotic medicines.  Being pregnant.  You cannot get bacterial vaginosis from toilet  seats, bedding, swimming pools, or contact with objects around you. What are the signs or symptoms? Symptoms of this condition include:  Grey or white vaginal discharge. The discharge can also be watery or foamy.  A fish-like odor with discharge, especially after sexual intercourse or during menstruation.  Itching in and around the vagina.  Burning or pain with urination.  Some women with bacterial vaginosis have no signs or symptoms. How is this diagnosed? This condition is diagnosed based on:  Your medical history.  A physical exam of the vagina.  Testing a sample of vaginal fluid under a microscope to look for a large amount of bad bacteria or abnormal cells. Your health care provider may use a cotton swab or a small wooden spatula to collect the sample.  How is this treated? This condition is treated with antibiotics. These may be given as a pill, a vaginal cream, or a medicine that is put into the vagina (suppository). If the condition comes back after treatment, a second round of antibiotics may be needed. Follow these instructions at home: Medicines  Take over-the-counter and prescription medicines only as told by your health care provider.  Take or use your antibiotic as told by your health care provider. Do not stop taking or using the antibiotic even if you start to feel better. General instructions  If you have a female sexual partner, tell her that you have a vaginal infection. She should see her health care provider and be treated if she has symptoms. If you have a female sexual partner, he does not need treatment.  During treatment: ? Avoid sexual activity until you finish treatment. ? Do not douche. ? Avoid alcohol as directed by your health care provider. ? Avoid breastfeeding as directed by your health care provider.  Drink enough water and fluids to keep your urine clear or pale yellow.  Keep the area around your vagina and rectum clean. ? Wash the area  daily with warm water. ? Wipe yourself from front to back after using the toilet.  Keep all follow-up visits as told by your health care provider. This is important. How is this prevented?  Do not douche.  Wash the outside of your vagina with warm water only.  Use protection when having sex. This includes latex condoms and dental dams.  Limit how many sexual partners you have. To help prevent bacterial vaginosis, it is best to have sex with just one partner (monogamous).  Make sure you and your sexual partner are tested for STIs.  Wear cotton or cotton-lined underwear.  Avoid wearing tight pants and pantyhose, especially during summer.  Limit the amount of alcohol that you drink.  Do not use any products that contain nicotine or tobacco, such as cigarettes and e-cigarettes. If you need help quitting, ask your health care provider.  Do not use illegal drugs. Where to find more information:  Centers for Disease Control and Prevention: SolutionApps.co.za  American Sexual Health Association (ASHA): www.ashastd.org  U.S. Department of Health and Health and safety inspector, Office on Women's Health: ConventionalMedicines.si or http://www.anderson-williamson.info/ Contact a health care provider if:  Your symptoms do not improve, even after treatment.  You have more discharge or pain when urinating.  You have a fever.  You have pain in your abdomen.  You have pain during sex.  You have vaginal bleeding between periods. Summary  Bacterial vaginosis is a vaginal infection that occurs when the normal balance of bacteria in the vagina is disrupted.  Because bacterial vaginosis increases your risk for STIs (sexually transmitted infections), getting treated can help reduce your risk for chlamydia, gonorrhea, herpes, and HIV (human immunodeficiency virus). Treatment is also important for preventing complications in pregnant women, because the condition can cause an early  (premature) delivery.  This condition is treated with antibiotic medicines. These may be given as a pill, a vaginal cream, or a medicine that is put into the vagina (suppository). This information is not intended to replace advice given to you by your health care provider. Make sure you discuss any questions you have with your health care provider. Document Released: 03/02/2005 Document Revised: 07/06/2016 Document Reviewed: 11/16/2015 Elsevier Interactive Patient Education  [employer].

## 2017-04-19 LAB — PAP IG, CT-NG NAA, HPV HIGH-RISK
Chlamydia, Nuc. Acid Amp: NEGATIVE
Gonococcus by Nucleic Acid Amp: NEGATIVE
HPV, HIGH-RISK: POSITIVE — AB
PAP Smear Comment: 0

## 2017-06-23 ENCOUNTER — Encounter: Payer: Self-pay | Admitting: Physician Assistant

## 2018-09-26 ENCOUNTER — Emergency Department (HOSPITAL_COMMUNITY)
Admission: EM | Admit: 2018-09-26 | Discharge: 2018-09-26 | Disposition: A | Discharge status: Home / Self Care | Payer: BC Managed Care – PPO | Attending: Emergency Medicine | Admitting: Emergency Medicine

## 2018-09-26 ENCOUNTER — Other Ambulatory Visit: Payer: Self-pay

## 2018-09-26 ENCOUNTER — Encounter (HOSPITAL_COMMUNITY): Payer: Self-pay | Admitting: Emergency Medicine

## 2018-09-26 ENCOUNTER — Emergency Department (HOSPITAL_COMMUNITY): Payer: BC Managed Care – PPO

## 2018-09-26 DIAGNOSIS — N939 Abnormal uterine and vaginal bleeding, unspecified: Secondary | ICD-10-CM | POA: Insufficient documentation

## 2018-09-26 DIAGNOSIS — R102 Pelvic and perineal pain: Secondary | ICD-10-CM | POA: Insufficient documentation

## 2018-09-26 DIAGNOSIS — R112 Nausea with vomiting, unspecified: Secondary | ICD-10-CM | POA: Diagnosis not present

## 2018-09-26 DIAGNOSIS — Z113 Encounter for screening for infections with a predominantly sexual mode of transmission: Secondary | ICD-10-CM | POA: Insufficient documentation

## 2018-09-26 DIAGNOSIS — Z79899 Other long term (current) drug therapy: Secondary | ICD-10-CM | POA: Diagnosis not present

## 2018-09-26 LAB — CBC WITH DIFFERENTIAL/PLATELET
Abs Immature Granulocytes: 0.04 10*3/uL (ref 0.00–0.07)
Basophils Absolute: 0 10*3/uL (ref 0.0–0.1)
Basophils Relative: 0 %
Eosinophils Absolute: 0 10*3/uL (ref 0.0–0.5)
Eosinophils Relative: 0 %
HCT: 32.6 % — ABNORMAL LOW (ref 36.0–46.0)
Hemoglobin: 10.1 g/dL — ABNORMAL LOW (ref 12.0–15.0)
Immature Granulocytes: 0 %
Lymphocytes Relative: 5 %
Lymphs Abs: 0.6 10*3/uL — ABNORMAL LOW (ref 0.7–4.0)
MCH: 27.3 pg (ref 26.0–34.0)
MCHC: 31 g/dL (ref 30.0–36.0)
MCV: 88.1 fL (ref 80.0–100.0)
Monocytes Absolute: 0.2 10*3/uL (ref 0.1–1.0)
Monocytes Relative: 1 %
Neutro Abs: 11.3 10*3/uL — ABNORMAL HIGH (ref 1.7–7.7)
Neutrophils Relative %: 94 %
Platelets: 261 10*3/uL (ref 150–400)
RBC: 3.7 MIL/uL — ABNORMAL LOW (ref 3.87–5.11)
RDW: 16.9 % — ABNORMAL HIGH (ref 11.5–15.5)
WBC: 12.1 10*3/uL — ABNORMAL HIGH (ref 4.0–10.5)
nRBC: 0 % (ref 0.0–0.2)

## 2018-09-26 LAB — HCG, QUANTITATIVE, PREGNANCY: hCG, Beta Chain, Quant, S: 8119 m[IU]/mL — ABNORMAL HIGH (ref <5)

## 2018-09-26 LAB — WET PREP, GENITAL
Clue Cells Wet Prep HPF POC: NONE SEEN
Sperm: NONE SEEN
Trich, Wet Prep: NONE SEEN
Yeast Wet Prep HPF POC: NONE SEEN

## 2018-09-26 LAB — URINALYSIS, ROUTINE W REFLEX MICROSCOPIC
Bilirubin Urine: NEGATIVE
Glucose, UA: NEGATIVE mg/dL
Ketones, ur: 5 mg/dL — AB
Leukocytes,Ua: NEGATIVE
Nitrite: NEGATIVE
Protein, ur: NEGATIVE mg/dL
RBC / HPF: >50 RBC/hpf — ABNORMAL HIGH (ref 0–5)
Specific Gravity, Urine: 1.017 (ref 1.005–1.030)
pH: 8 (ref 5.0–8.0)

## 2018-09-26 LAB — COMPREHENSIVE METABOLIC PANEL
ALT: 19 U/L (ref 0–44)
AST: 22 U/L (ref 15–41)
Albumin: 4.3 g/dL (ref 3.5–5.0)
Alkaline Phosphatase: 45 U/L (ref 38–126)
Anion gap: 10 (ref 5–15)
BUN: 10 mg/dL (ref 6–20)
CO2: 23 mmol/L (ref 22–32)
Calcium: 9.1 mg/dL (ref 8.9–10.3)
Chloride: 105 mmol/L (ref 98–111)
Creatinine, Ser: 0.64 mg/dL (ref 0.44–1.00)
GFR calc Af Amer: >60 mL/min (ref >60)
GFR calc non Af Amer: >60 mL/min (ref >60)
Glucose, Bld: 110 mg/dL — ABNORMAL HIGH (ref 70–99)
Potassium: 3.7 mmol/L (ref 3.5–5.1)
Sodium: 138 mmol/L (ref 135–145)
Total Bilirubin: 0.3 mg/dL (ref 0.3–1.2)
Total Protein: 7.6 g/dL (ref 6.5–8.1)

## 2018-09-26 LAB — I-STAT BETA HCG BLOOD, ED (MC, WL, AP ONLY): I-stat hCG, quantitative: >2000 m[IU]/mL — ABNORMAL HIGH (ref <5)

## 2018-09-26 MED ORDER — SODIUM CHLORIDE 0.9 % IV BOLUS
1000.0000 mL | Freq: Once | INTRAVENOUS | Status: AC
  Administered 2018-09-26: 1000 mL via INTRAVENOUS

## 2018-09-26 MED ORDER — KETOROLAC TROMETHAMINE 30 MG/ML IJ SOLN
30.0000 mg | Freq: Once | INTRAMUSCULAR | Status: AC
  Administered 2018-09-26: 30 mg via INTRAVENOUS
  Filled 2018-09-26: qty 1

## 2018-09-26 MED ORDER — OXYCODONE-ACETAMINOPHEN 5-325 MG PO TABS: 1.0000 | ORAL_TABLET | Freq: Three times a day (TID) | ORAL | 0 refills | Status: AC | PRN

## 2018-09-26 MED ORDER — ONDANSETRON HCL 4 MG/2ML IJ SOLN
4.0000 mg | Freq: Once | INTRAMUSCULAR | Status: AC
  Administered 2018-09-26: 4 mg via INTRAVENOUS
  Filled 2018-09-26: qty 2

## 2018-09-26 MED ORDER — OXYCODONE-ACETAMINOPHEN 5-325 MG PO TABS: 2.0000 | ORAL_TABLET | ORAL | 0 refills | Status: DC | PRN

## 2018-09-26 MED ORDER — ONDANSETRON 4 MG PO TBDP: 4.0000 mg | ORAL_TABLET | Freq: Three times a day (TID) | ORAL | 0 refills | Status: DC | PRN

## 2018-09-26 MED ORDER — MORPHINE SULFATE (PF) 4 MG/ML IV SOLN
4.0000 mg | Freq: Once | INTRAVENOUS | Status: AC
  Administered 2018-09-26: 4 mg via INTRAVENOUS
  Filled 2018-09-26: qty 1

## 2018-09-26 NOTE — ED Triage Notes (Signed)
Pt reports had an abortion last Wed and since last night been having pelvic pains with n/v/d.

## 2018-09-26 NOTE — Discharge Instructions (Signed)
Please have your hCG levels rechecked by her OB/GYN provider to ensure that they are trending downwards. Take medication as needed for pain in addition to the ibuprofen. Return to the ED if you start to experience worsening symptoms, worsening bleeding, lightheadedness, increase in pain/

## 2018-09-26 NOTE — ED Provider Notes (Signed)
[facility] DEPT Provider Note   CSN: [id number] Arrival date & time: 09/26/18  1008    History   Chief Complaint Chief Complaint  Patient presents with   Pelvic Pain    HPI Allison Briggs is a 28 y.o. female with a past medical history of anemia, s/p therapeutic abortion on 09/21/2018 with medication presents to ED for worsening abdominal cramping, nausea, vomiting and diarrhea for the past 2 days. States that she had spotting and cramping since taking medication for the abortion. She feels though the vomiting and diarrhea is related to the pain from her cramping. She had worsening cramping this morning that has not been improved since taking ibuprofen this morning. This is her first TAB, her prior pregnancy resulted in SAB in 2017. She denies changes in bleeding, fever, loss of consciousness, shortness of breath, urinary symptoms although does note pressure sensation in pelvic area with urination. She denies any hematemesis, hematochezia, melena or sick contacts.     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 Authorizing Provider    diphenhydrAMINE (BENADRYL) 25 mg capsule Take 25 mg by mouth every 6 (six) hours as needed for allergies.   Yes [provider]  ibuprofen (ADVIL) 800 MG tablet Take 800 mg by mouth every 8 (eight) hours as needed for pain. 09/20/18  Yes [provider]  norelgestromin-ethinyl estradiol (ORTHO EVRA) 150-35 MCG/24HR transdermal patch Place 1 patch onto the skin once a week. 04/14/17  Yes Forrest Moron, MD  promethazine (PHENERGAN) 25 MG tablet Take 25 mg by mouth every 4 (four) hours as needed for nausea/vomiting. 09/20/18  Yes [provider]  cetirizine (ZYRTEC) 10 MG tablet Take 1 tablet (10 mg total) by mouth daily. Patient not taking: Reported on 04/14/2017 06/25/16   Ivar Drape D, PA  hydrOXYzine (ATARAX/VISTARIL) 25 MG tablet Take 0.5-1 tablets (12.5-25 mg total) by mouth every 8 (eight) hours as needed for itching. Patient not taking: Reported on 04/14/2017 06/25/16   Ivar Drape D, PA  norgestimate-ethinyl estradiol (ORTHO-CYCLEN,SPRINTEC,PREVIFEM) 0.25-35 MG-MCG tablet Take 1 tablet by mouth daily. Patient not taking: Reported on 06/25/2016 12/27/14   Constant, Peggy, MD  ondansetron (ZOFRAN ODT) 4 MG disintegrating tablet Take 1 tablet (4 mg total) by mouth every 8 (eight) hours as needed for nausea or vomiting. 09/26/18   Delia Heady, PA-C  oxyCODONE-acetaminophen (  PERCOCET/ROXICET) 5-325 MG tablet Take 2 tablets by mouth every 4 (four) hours as needed for severe pain. 09/26/18   Ketrina Boateng, PA-C  triamcinolone (NASACORT) 55 MCG/ACT AERO nasal inhaler Place 2 sprays into the nose daily. Patient not taking: Reported on 04/14/2017 10/19/16   Isa RankinMurray, Laura Spinello, MD  triamcinolone cream (KENALOG) 0.1 % Apply 1 application topically 2 (two) times daily. Patient not taking: Reported on 04/14/2017 06/25/16   Garnetta BuddyEnglish, Stephanie D, PA    Family History Family History  Problem Relation Age of Onset   Hypertension Maternal Grandmother    Diabetes Maternal  Grandmother    Hypertension Paternal Grandmother    Diabetes Other        great uncle maternal    Social History Social History   Tobacco Use   Smoking status: Never Smoker   Smokeless tobacco: Never Used  Substance Use Topics   Alcohol use: No    Alcohol/week: 0.0 standard drinks   Drug use: Yes    Types: Marijuana     Allergies   Latex   Review of Systems Review of Systems  Constitutional: Negative for appetite change, chills and fever.  HENT: Negative for ear pain, rhinorrhea, sneezing and sore throat.   Eyes: Negative for photophobia and visual disturbance.  Respiratory: Negative for cough, chest tightness, shortness of breath and wheezing.   Cardiovascular: Negative for chest pain and palpitations.  Gastrointestinal: Positive for abdominal pain, diarrhea, nausea and vomiting. Negative for blood in stool and constipation.  Genitourinary: Positive for dysuria, pelvic pain and vaginal bleeding. Negative for hematuria and urgency.  Musculoskeletal: Negative for myalgias.  Skin: Negative for rash.  Neurological: Negative for dizziness, weakness and light-headedness.     Physical Exam Updated Vital Signs BP 109/71 (BP Location: Right Arm)    Pulse 75    Temp 98.1 F (36.7 C) (Oral)    Resp 16    SpO2 99%   Physical Exam Vitals signs and nursing note reviewed. Exam conducted with a chaperone present.  Constitutional:      General: She is not in acute distress.    Appearance: She is well-developed.  HENT:     Head: Normocephalic and atraumatic.     Nose: Nose normal.  Eyes:     General: No scleral icterus.       Right eye: No discharge.        Left eye: No discharge.     Conjunctiva/sclera: Conjunctivae normal.  Neck:     Musculoskeletal: Normal range of motion and neck supple.  Cardiovascular:     Rate and Rhythm: Normal rate and regular rhythm.     Heart sounds: Normal heart sounds. No murmur. No friction rub. No gallop.   Pulmonary:     Effort:  Pulmonary effort is normal. No respiratory distress.     Breath sounds: Normal breath sounds.  Abdominal:     General: Bowel sounds are normal. There is no distension.     Palpations: Abdomen is soft.     Tenderness: There is abdominal tenderness (suprapubic). There is no guarding.  Genitourinary:    Vagina: Vaginal discharge and bleeding present.     Cervix: Cervical bleeding present.     Adnexa:        Right: No tenderness.         Left: No tenderness.       Comments: Pelvic exam: normal external genitalia without evidence of trauma. VULVA: normal appearing vulva with no masses, tenderness or lesion. VAGINA: normal  appearing vagina with normal color and discharge, no lesions. Blood noted in vault. CERVIX: normal appearing cervix without lesions, cervical motion tenderness absent, cervical os with some bleeding; Wet prep and DNA probe for chlamydia and GC obtained.   ADNEXA: normal adnexa in size, nontender and no masses UTERUS: uterus is normal size, shape, consistency and nontender.   Musculoskeletal: Normal range of motion.  Skin:    General: Skin is warm and dry.     Findings: No rash.  Neurological:     Mental Status: She is alert.     Motor: No abnormal muscle tone.     Coordination: Coordination normal.      ED Treatments / Results  Labs (all labs ordered are listed, but only abnormal results are displayed) Labs Reviewed  WET PREP, GENITAL - Abnormal; Notable for the following components:      Result Value   WBC, Wet Prep HPF POC FEW (*)    All other components within normal limits  COMPREHENSIVE METABOLIC PANEL - Abnormal; Notable for the following components:   Glucose, Bld 110 (*)    All other components within normal limits  CBC WITH DIFFERENTIAL/PLATELET - Abnormal; Notable for the following components:   WBC 12.1 (*)    RBC 3.70 (*)    Hemoglobin 10.1 (*)    HCT 32.6 (*)    RDW 16.9 (*)    Neutro Abs 11.3 (*)    Lymphs Abs 0.6 (*)    All other components  within normal limits  URINALYSIS, ROUTINE W REFLEX MICROSCOPIC - Abnormal; Notable for the following components:   Hgb urine dipstick MODERATE (*)    Ketones, ur 5 (*)    RBC / HPF >50 (*)    Bacteria, UA RARE (*)    All other components within normal limits  HCG, QUANTITATIVE, PREGNANCY - Abnormal; Notable for the following components:   hCG, Beta Chain, Quant, S 8,119 (*)    All other components within normal limits  I-STAT BETA HCG BLOOD, ED ([facility], [facility], [facility] ONLY) - Abnormal; Notable for the following components:   I-stat hCG, quantitative >2,000.0 (*)    All other components within normal limits  GC/CHLAMYDIA PROBE AMP ([place]) NOT AT [facility]    EKG None  Radiology [place] Ob Comp < 14 Wks  Result Date: 09/26/2018 CLINICAL DATA:  28 year old female with severe pelvic pain and nausea vomiting for 1 day. Patient took "abortion pills" on 09/21/2018. Beta HCG of 8,000. EXAM: OBSTETRIC <14 WK US AND TRANSVAGINAL OB US TECHNIQUE: Both transabdominal and transvaginal ultrasound examinations were performed for complete evaluation of the gestation as well as the maternal uterus, adnexal regions, and pelvic cul-de-sac. Transvaginal technique was performed to assess early pregnancy. COMPARISON:  None. FINDINGS: Intrauterine gestational sac: None Yolk sac:  Not Visualized. Embryo:  Not Visualized. Maternal uterus/adnexae: The endometrium is slightly heterogeneous and measures 20 mm in greatest diameter. No significant ovarian abnormalities are noted. A small to moderate amount of free fluid in the pelvis is noted. IMPRESSION: 1. Small to moderate amount of free pelvic fluid with no IUP or adnexal mass visualized. Differential diagnosis includes recent abortion, IUP too early to visualize, and occult ectopic pregnancy. Recommend close follow up of quantitative B-HCG levels, and follow up US as clinically warranted. Electronically Signed   By: Harmon PierJeffrey  Hu M.D.   On: 09/26/2018 16:18   [place] [facility]  Transvaginal  Result Date: 09/26/2018 CLINICAL DATA:  28 year old female with severe pelvic pain and nausea vomiting for  1 day. Patient took "abortion pills" on 09/21/2018. Beta HCG of 8,000. EXAM: OBSTETRIC <14 WK US AND TRANSVAGINAL OB US TECHNIQUE: Both transabdominal and transvaginal ultrasound examinations were performed for complete evaluation of the gestation as well as the maternal uterus, adnexal regions, and pelvic cul-de-sac. Transvaginal technique was performed to assess early pregnancy. COMPARISON:  None. FINDINGS: Intrauterine gestational sac: None Yolk sac:  Not Visualized. Embryo:  Not Visualized. Maternal uterus/adnexae: The endometrium is slightly heterogeneous and measures 20 mm in greatest diameter. No significant ovarian abnormalities are noted. A small to moderate amount of free fluid in the pelvis is noted. IMPRESSION: 1. Small to moderate amount of free pelvic fluid with no IUP or adnexal mass visualized. Differential diagnosis includes recent abortion, IUP too early to visualize, and occult ectopic pregnancy. Recommend close follow up of quantitative B-HCG levels, and follow up US as clinically warranted. Electronically Signed   By: Harmon PierJeffrey  Hu M.D.   On: 09/26/2018 16:18    Procedures Procedures (including critical care time)  Medications Ordered in ED Medications  sodium chloride 0.9 % bolus 1,000 mL (0 mLs Intravenous Stopped 09/26/18 1551)  ondansetron (ZOFRAN) injection 4 mg (4 mg Intravenous Given 09/26/18 1345)  morphine 4 MG/ML injection 4 mg (4 mg Intravenous Given 09/26/18 1345)  ketorolac (TORADOL) 30 MG/ML injection 30 mg (30 mg Intravenous Given 09/26/18 1630)     Initial Impression / Assessment and Plan / ED Course  I have reviewed the triage vital signs and the nursing notes.  Pertinent labs & imaging results that were available during my care of the patient were reviewed by me and considered in my medical decision making (see chart for details).         28 year old female with past medical history of anemia, status post abortion with medication on 09/21/2018 presents to ED for worsening abdominal cramping, nausea, vomiting and diarrhea.  She has had spotting and cramping since taking the medication.  The amount of spotting has not changed.  Cramping significantly worsened this morning and has not improved with NSAIDs.  She states is concerned that the vomiting and diarrhea is due to her pain.  On my exam abdomen is tender in the suprapubic area without rebound or guarding.  She does appear uncomfortable.  hCG quant is in the 8000, no prior comparison value.  Hemoglobin is at baseline.  Mild leukocytosis of 12 could be reactive.  Wet prep with some white blood cells.  GC chlamydia probe pending.  Urinalysis is unremarkable.  Ultrasound shows small amount of free fluid in the pelvis.  Most likely this is due to her recent abortion.  Will give short course of pain medication, have her continue NSAIDs and follow-up with OB/GYN for repeat quant to ensure that it is trending downward.  Patient agreeable to the plan, appears more comfortable here.  Advised to return for worsening symptoms.  Patient is hemodynamically stable, in NAD, and able to ambulate in the ED. Evaluation does not show pathology that would require ongoing emergent intervention or inpatient treatment. I explained the diagnosis to the patient. Pain has been managed and has no complaints prior to discharge. Patient is comfortable with above plan and is stable for discharge at this time. All questions were answered prior to disposition. Strict return precautions for returning to the ED were discussed. Encouraged follow up with PCP.   An After Visit Summary was printed and given to the patient.   Portions of this note were generated with Scientist, clinical (histocompatibility and immunogenetics)Dragon dictation software.  Dictation errors may occur despite best attempts at proofreading.   Final Clinical Impressions(s) / ED Diagnoses   Final diagnoses:   Pelvic pain in female    ED Discharge Orders         Ordered    oxyCODONE-acetaminophen (PERCOCET/ROXICET) 5-325 MG tablet  Every 4 hours PRN     09/26/18 1628    ondansetron (ZOFRAN ODT) 4 MG disintegrating tablet  Every 8 hours PRN     09/26/18 1628           Dietrich Pates, PA-C 09/26/18 1637    Gerhard Munch, MD 09/27/18 1515

## 2018-09-27 LAB — GC/CHLAMYDIA PROBE AMP (~~LOC~~) NOT AT ARMC
Chlamydia: NEGATIVE
Neisseria Gonorrhea: NEGATIVE

## 2019-04-05 ENCOUNTER — Other Ambulatory Visit (HOSPITAL_COMMUNITY)
Admission: RE | Admit: 2019-04-05 | Discharge: 2019-04-05 | Disposition: A | Discharge status: Home / Self Care | Payer: BC Managed Care – PPO | Source: Ambulatory Visit | Attending: Adult Health Nurse Practitioner | Admitting: Adult Health Nurse Practitioner

## 2019-04-05 ENCOUNTER — Ambulatory Visit: Payer: BC Managed Care – PPO | Admitting: Adult Health Nurse Practitioner

## 2019-04-05 ENCOUNTER — Telehealth: Payer: Self-pay | Admitting: Adult Health Nurse Practitioner

## 2019-04-05 ENCOUNTER — Other Ambulatory Visit: Payer: Self-pay

## 2019-04-05 VITALS — BP 108/50 | HR 79 | Temp 98.3°F | Ht 70.0 in | Wt 147.8 lb

## 2019-04-05 DIAGNOSIS — N870 Mild cervical dysplasia: Secondary | ICD-10-CM | POA: Diagnosis not present

## 2019-04-05 DIAGNOSIS — Z113 Encounter for screening for infections with a predominantly sexual mode of transmission: Secondary | ICD-10-CM | POA: Insufficient documentation

## 2019-04-05 DIAGNOSIS — N760 Acute vaginitis: Secondary | ICD-10-CM | POA: Insufficient documentation

## 2019-04-05 DIAGNOSIS — Z23 Encounter for immunization: Secondary | ICD-10-CM

## 2019-04-05 DIAGNOSIS — B379 Candidiasis, unspecified: Secondary | ICD-10-CM | POA: Diagnosis present

## 2019-04-05 DIAGNOSIS — B9689 Other specified bacterial agents as the cause of diseases classified elsewhere: Secondary | ICD-10-CM

## 2019-04-05 LAB — POCT URINALYSIS DIP (MANUAL ENTRY)
Bilirubin, UA: NEGATIVE
Blood, UA: NEGATIVE
Glucose, UA: NEGATIVE mg/dL
Leukocytes, UA: NEGATIVE
Nitrite, UA: NEGATIVE
Protein Ur, POC: NEGATIVE mg/dL
Spec Grav, UA: 1.025 (ref 1.010–1.025)
Urobilinogen, UA: 0.2 E.U./dL
pH, UA: 5.5 (ref 5.0–8.0)

## 2019-04-05 LAB — POCT WET + KOH PREP
Trich by wet prep: ABSENT
Yeast by wet prep: ABSENT

## 2019-04-05 MED ORDER — METRONIDAZOLE 0.75 % VA GEL: 1.0000 | Freq: Two times a day (BID) | VAGINAL | 0 refills | Status: AC

## 2019-04-05 MED ORDER — FLUCONAZOLE 150 MG PO TABS: ORAL_TABLET | ORAL | 3 refills | Status: AC

## 2019-04-05 NOTE — Telephone Encounter (Signed)
Please advise on the POC visit for the pt. She was just seen and would like some of the results that are available.

## 2019-04-05 NOTE — Telephone Encounter (Signed)
Pt would like a cb concerning her lab results from today 04/04/19.please advise at [phone]. She is very anxious

## 2019-04-05 NOTE — Patient Instructions (Signed)
° ° ° °  If you have lab work done today you will be contacted with your lab results within the next 2 weeks.  If you have not heard from us then please contact us. The fastest way to get your results is to register for My Chart. ° ° °IF you received an x-ray today, you will receive an invoice from [facility]. Please contact [facility] at [phone] with questions or concerns regarding your invoice.  ° °IF you received labwork today, you will receive an invoice from [facility]. Please contact [facility] at [phone] with questions or concerns regarding your invoice.  ° °Our billing staff will not be able to assist you with questions regarding bills from these companies. ° °You will be contacted with the lab results as soon as they are available. The fastest way to get your results is to activate your My Chart account. Instructions are located on the last page of this paperwork. If you have not heard from us regarding the results in 2 weeks, please contact this office. °  ° ° ° °

## 2019-04-06 ENCOUNTER — Telehealth: Payer: Self-pay | Admitting: *Deleted

## 2019-04-06 ENCOUNTER — Encounter: Payer: Self-pay | Admitting: *Deleted

## 2019-04-06 LAB — GC/CHLAMYDIA PROBE AMP (~~LOC~~) NOT AT ARMC
Chlamydia: NEGATIVE
Comment: NEGATIVE
Comment: NORMAL
Neisseria Gonorrhea: NEGATIVE

## 2019-04-06 LAB — RPR: RPR Ser Ql: NONREACTIVE

## 2019-04-06 LAB — HIV ANTIBODY (ROUTINE TESTING W REFLEX): HIV Screen 4th Generation wRfx: NONREACTIVE

## 2019-04-07 ENCOUNTER — Encounter: Payer: Self-pay | Admitting: Adult Health Nurse Practitioner

## 2019-04-07 DIAGNOSIS — Z23 Encounter for immunization: Secondary | ICD-10-CM | POA: Insufficient documentation

## 2019-04-07 DIAGNOSIS — B379 Candidiasis, unspecified: Secondary | ICD-10-CM | POA: Insufficient documentation

## 2019-04-07 DIAGNOSIS — B9689 Other specified bacterial agents as the cause of diseases classified elsewhere: Secondary | ICD-10-CM | POA: Insufficient documentation

## 2019-04-07 NOTE — Progress Notes (Signed)
SUBJECTIVE:  29 y.o. female complains of foul, malodorous, thick and yellow vaginal discharge for 3-4   Day(s).  Recently had a sexual partner without protection.  Denies abnormal vaginal bleeding or significant pelvic pain or fever. No UTI symptoms. Denies history of known exposure to STD.  No LMP recorded.  OBJECTIVE:  She appears well, afebrile. Abdomen: benign, soft, nontender, no masses. Pelvic Exam: VULVA: normal appearing vulva with no masses, tenderness or lesions, VAGINA: vaginal discharge - copious, frothy and thick, CERVIX: friable , ectropion noted, cervical discharge present - copious and frothy, WET MOUNT done - results: KOH done, clue cells, excessive bacteria, hyphae, DNA probe for chlamydia and GC obtained, DNA probe for chlamydia and GC obtained, cervical motion tenderness absent, UTERUS: uterus is normal size, shape, consistency and nontender, ADNEXA: normal adnexa in size, nontender and no masses. Urine dipstick: positive for ketones.  ASSESSMENT:  bacterial vaginosis, rule out GC or chlamydia and C. albicans vulvovaginitis  PLAN:  GC and chlamydia DNA  probe sent to lab. Treatment: Flagyl 500 BID x 7 days, abstain from coitus during course of treatment and Diflucan 150mg  x 2.  ROV prn if symptoms persist or worsen.   A total of 30 were spent face-to-face with the patient during this encounter and over half of that time was spent on counseling, education and coordination of care.

## 2019-04-17 NOTE — Telephone Encounter (Signed)
Elease Hashimoto will call patient.

## 2019-07-28 ENCOUNTER — Encounter (HOSPITAL_BASED_OUTPATIENT_CLINIC_OR_DEPARTMENT_OTHER): Payer: Self-pay | Admitting: *Deleted

## 2019-07-28 ENCOUNTER — Emergency Department (HOSPITAL_BASED_OUTPATIENT_CLINIC_OR_DEPARTMENT_OTHER)
Admission: EM | Admit: 2019-07-28 | Discharge: 2019-07-28 | Disposition: A | Discharge status: Home / Self Care | Payer: BC Managed Care – PPO | Attending: Emergency Medicine | Admitting: Emergency Medicine

## 2019-07-28 ENCOUNTER — Other Ambulatory Visit: Payer: Self-pay

## 2019-07-28 DIAGNOSIS — F1210 Cannabis abuse, uncomplicated: Secondary | ICD-10-CM | POA: Diagnosis not present

## 2019-07-28 DIAGNOSIS — Z79899 Other long term (current) drug therapy: Secondary | ICD-10-CM | POA: Insufficient documentation

## 2019-07-28 DIAGNOSIS — R112 Nausea with vomiting, unspecified: Secondary | ICD-10-CM | POA: Diagnosis not present

## 2019-07-28 DIAGNOSIS — Z91040 Latex allergy status: Secondary | ICD-10-CM | POA: Insufficient documentation

## 2019-07-28 DIAGNOSIS — R109 Unspecified abdominal pain: Secondary | ICD-10-CM

## 2019-07-28 DIAGNOSIS — N939 Abnormal uterine and vaginal bleeding, unspecified: Secondary | ICD-10-CM | POA: Diagnosis not present

## 2019-07-28 DIAGNOSIS — R1030 Lower abdominal pain, unspecified: Secondary | ICD-10-CM | POA: Diagnosis present

## 2019-07-28 LAB — PREGNANCY, URINE: Preg Test, Ur: NEGATIVE

## 2019-07-28 LAB — URINALYSIS, ROUTINE W REFLEX MICROSCOPIC
Bilirubin Urine: NEGATIVE
Glucose, UA: NEGATIVE mg/dL
Ketones, ur: NEGATIVE mg/dL
Leukocytes,Ua: NEGATIVE
Nitrite: NEGATIVE
Protein, ur: 100 mg/dL — AB
Specific Gravity, Urine: >1.03 — ABNORMAL HIGH (ref 1.005–1.030)
pH: 5 (ref 5.0–8.0)

## 2019-07-28 LAB — URINALYSIS, MICROSCOPIC (REFLEX): RBC / HPF: >50 RBC/hpf (ref 0–5)

## 2019-07-28 LAB — WET PREP, GENITAL
Clue Cells Wet Prep HPF POC: NONE SEEN
Sperm: NONE SEEN
Trich, Wet Prep: NONE SEEN
Yeast Wet Prep HPF POC: NONE SEEN

## 2019-07-28 MED ORDER — KETOROLAC TROMETHAMINE 30 MG/ML IJ SOLN
30.0000 mg | Freq: Once | INTRAMUSCULAR | Status: AC
  Administered 2019-07-28: 30 mg via INTRAMUSCULAR
  Filled 2019-07-28: qty 1

## 2019-07-28 MED ORDER — ONDANSETRON 4 MG PO TBDP
4.0000 mg | ORAL_TABLET | Freq: Once | ORAL | Status: AC
  Administered 2019-07-28: 4 mg via ORAL
  Filled 2019-07-28: qty 1

## 2019-07-28 MED ORDER — ONDANSETRON 4 MG PO TBDP
4.0000 mg | ORAL_TABLET | Freq: Three times a day (TID) | ORAL | 0 refills | Status: AC | PRN
Start: 2019-07-28 — End: ?

## 2019-07-28 MED ORDER — HYDROCODONE-ACETAMINOPHEN 5-325 MG PO TABS
1.0000 | ORAL_TABLET | Freq: Once | ORAL | Status: AC
  Administered 2019-07-28: 1 via ORAL
  Filled 2019-07-28: qty 1

## 2019-07-28 NOTE — ED Provider Notes (Signed)
[facility] EMERGENCY DEPARTMENT Provider Note   CSN: [id number] Arrival date & time: 07/28/19  1517    History Chief Complaint  Patient presents with  . Abdominal Pain   Allison Briggs is a 29 y.o. female with past medical history significant for anemia, BV who presents for evaluation of low abdominal cramping.  Start her menstrual cycle earlier today.  Has history of severe menstrual cramps.  She has not take anything for pain.  Rates her pain a 8/10.  Has recently missed a few birth control pills.  States she has "normal discharge" which she relates to her BV.  1 episode of emesis just PTA.  Denies fever, chills, chest pain, shortness of breath, upper abdominal pain, dysuria, hematuria, flank pain, diarrhea, constipation.  Denies aggravating or relieving factors. Rates pain a 9/10.   History obtained from patient and past medical records.  No interpreter is used.  HPI     Past Medical History:  Diagnosis Date  . Anemia   . BV (bacterial vaginosis)    09/15/13  . STD (sexually transmitted disease)    h/o chlamydia, RRP  . Trichimoniasis   . Vaginal Pap smear, abnormal     Patient Active Problem List   Diagnosis Date Noted  . Bacterial vaginosis 04/07/2019  . Need for prophylactic vaccination with combined diphtheria-tetanus-pertussis (DTP) vaccine 04/07/2019  . Yeast infection 04/07/2019  . Vaginal bleeding, abnormal 11/23/2014  . 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 Dysmenorrhea 10/20/2013  . Vaginal discharge 10/20/2013  . Routine screening for STI (sexually transmitted infection) 10/20/2013  . Dyspareunia 10/20/2013    Past Surgical History:  Procedure Laterality Date  . CERVICAL BIOPSY  2016     OB History    Gravida  1   Para  0   Term  0   Preterm  0   AB  1   Living  0     SAB  1   TAB  0   Ectopic  0   Multiple  0   Live Births              Family History  Problem Relation Age of Onset  . Hypertension  Maternal Grandmother   . Diabetes Maternal Grandmother   . Hypertension Paternal Grandmother   . Diabetes Other        great uncle maternal    Social History   Tobacco Use  . Smoking status: Never Smoker  . Smokeless tobacco: Never Used  Substance Use Topics  . Alcohol use: No    Alcohol/week: 0.0 standard drinks  . Drug use: Yes    Types: Marijuana    Home Medications Prior to Admission medications   Medication Sig Start Date End Date Taking? Authorizing Provider  cetirizine (ZYRTEC) 10 MG tablet Take 1 tablet (10 mg total) by mouth daily. 06/25/16   Trena Platt D, PA  diphenhydrAMINE (BENADRYL) 25 mg capsule Take 25 mg by mouth every 6 (six) hours as needed for allergies.    [provider]  fluconazole (DIFLUCAN) 150 MG tablet One tab today, and one tab in 5 days 04/05/19   Royal Hawthorn, NP  hydrOXYzine (ATARAX/VISTARIL) 25 MG tablet Take 0.5-1 tablets (12.5-25 mg total) by mouth every 8 (eight) hours as needed for itching. 06/25/16   Trena Platt D, PA  ibuprofen (ADVIL) 800 MG tablet Take 800 mg by mouth every 8 (eight) hours as needed for pain. 09/20/18   [provider]  norelgestromin-ethinyl  estradiol (ORTHO EVRA) 150-35 MCG/24HR transdermal patch Place 1 patch onto the skin once a week. 04/14/17   Forrest Moron, MD  norgestimate-ethinyl estradiol (ORTHO-CYCLEN,SPRINTEC,PREVIFEM) 0.25-35 MG-MCG tablet Take 1 tablet by mouth daily. 12/27/14   Constant, Peggy, MD  ondansetron (ZOFRAN ODT) 4 MG disintegrating tablet Take 1 tablet (4 mg total) by mouth every 8 (eight) hours as needed for nausea or vomiting. 07/28/19   Kesa Birky A, PA-C  oxyCODONE-acetaminophen (PERCOCET/ROXICET) 5-325 MG tablet Take 1 tablet by mouth every 8 (eight) hours as needed for severe pain. 09/26/18   Khatri, Hina, PA-C  promethazine (PHENERGAN) 25 MG tablet Take 25 mg by mouth every 4 (four) hours as needed for nausea/vomiting. 09/20/18   [provider]   triamcinolone (NASACORT) 55 MCG/ACT AERO nasal inhaler Place 2 sprays into the nose daily. 10/19/16   Wynona Luna, MD  triamcinolone cream (KENALOG) 0.1 % Apply 1 application topically 2 (two) times daily. 06/25/16   Ivar Drape D, PA    Allergies    Latex  Review of Systems   Review of Systems  Constitutional: Negative.   HENT: Negative.   Respiratory: Negative.   Cardiovascular: Negative.   Gastrointestinal: Positive for abdominal pain, nausea and vomiting. Negative for abdominal distention, anal bleeding, blood in stool, constipation, diarrhea and rectal pain.  Genitourinary: Positive for menstrual problem. Negative for decreased urine volume, difficulty urinating, dysuria, flank pain, frequency, genital sores, hematuria, pelvic pain, urgency, vaginal bleeding, vaginal discharge and vaginal pain.  Musculoskeletal: Negative.   Neurological: Negative.   All other systems reviewed and are negative.   Physical Exam Updated Vital Signs BP 115/74   Pulse 88   Temp 97.9 F (36.6 C) (Oral)   Resp 20   Ht 5\' 10"  (1.778 m)   Wt 67 kg   LMP 07/28/2019   SpO2 99%   BMI 21.19 kg/m   Physical Exam Vitals and nursing note reviewed. Exam conducted with a chaperone present.  Constitutional:      General: She is not in acute distress.    Appearance: She is well-developed. She is not ill-appearing, toxic-appearing or diaphoretic.  HENT:     Head: Normocephalic and atraumatic.     Mouth/Throat:     Mouth: Mucous membranes are moist.  Eyes:     Pupils: Pupils are equal, round, and reactive to light.  Cardiovascular:     Rate and Rhythm: Normal rate.     Heart sounds: Normal heart sounds.  Pulmonary:     Effort: Pulmonary effort is normal. No respiratory distress.     Breath sounds: Normal breath sounds.  Abdominal:     General: Bowel sounds are normal. There is no distension.     Palpations: Abdomen is soft.     Tenderness: There is abdominal tenderness in the  suprapubic area. There is no right CVA tenderness, left CVA tenderness, guarding or rebound. Negative signs include Murphy's sign and McBurney's sign.     Hernia: No hernia is present.  Genitourinary:    Comments: Normal appearing external female genitalia without rashes or lesions, normal vaginal epithelium. Normal appearing cervix without discharge or petechiae. Cervical os is open. There is bleeding noted at the os. No dor. Bimanual: No CMT,  nontender.  No palpable adnexal masses or tenderness. Uterus midline and not fixed. Rectovaginal exam was deferred.  No cystocele or rectocele noted. No pelvic lymphadenopathy noted. Wet prep was obtained.  Cultures for gonorrhea and chlamydia collected. Exam performed with chaperone in room. Musculoskeletal:  General: Normal range of motion.     Cervical back: Normal range of motion.  Skin:    General: Skin is warm and dry.     Capillary Refill: Capillary refill takes less than 2 seconds.  Neurological:     Mental Status: She is alert.    ED Results / Procedures / Treatments   Labs (all labs ordered are listed, but only abnormal results are displayed) Labs Reviewed  WET PREP, GENITAL - Abnormal; Notable for the following components:      Result Value   WBC, Wet Prep HPF POC MANY (*)    All other components within normal limits  URINALYSIS, ROUTINE W REFLEX MICROSCOPIC - Abnormal; Notable for the following components:   Color, Urine BROWN (*)    APPearance CLOUDY (*)    Specific Gravity, Urine >1.030 (*)    Hgb urine dipstick LARGE (*)    Protein, ur 100 (*)    All other components within normal limits  URINALYSIS, MICROSCOPIC (REFLEX) - Abnormal; Notable for the following components:   Bacteria, UA MANY (*)    All other components within normal limits  URINE CULTURE  PREGNANCY, URINE  GC/CHLAMYDIA PROBE AMP ([place]) NOT AT [facility]    EKG None  Radiology No results found.  Procedures Procedures (including critical care  time)  Medications Ordered in ED Medications  ondansetron (ZOFRAN-ODT) disintegrating tablet 4 mg (4 mg Oral Given 07/28/19 1632)  HYDROcodone-acetaminophen (NORCO/VICODIN) 5-325 MG per tablet 1 tablet (1 tablet Oral Given 07/28/19 1724)  ketorolac (TORADOL) 30 MG/ML injection 30 mg (30 mg Intramuscular Given 07/28/19 1724)   ED Course  I have reviewed the triage vital signs and the nursing notes.  Pertinent labs & imaging results that were available during my care of the patient were reviewed by me and considered in my medical decision making (see chart for details).  29 year old female presents for evaluation of abdominal cramping.  Start her menstrual cycle earlier today.  History of severe cramping.  She has no focal pain to her right or left lower quadrant.  No concerns for STDs.  States she has her "normal" vaginal discharge.  Has history of recurrent BV.  1 episode of emesis PTA.  She is not take anything for symptoms. Plan on pelvic, UA, preg and reassess.  GU with blood at vault. No CMT, adnexal tenderness. Does not want empiric abx of STDs.  Pregnancy test negative  UA with many bacteria however negative Leuks and nitrite. Will culture. No dysuria, frequency.  Patient reassessed. Patient requesting food and drink. High suspicion patient pain from her menstrual cycle. No focal pain.  Reassessed.  Pain with significant provement with Toradol.  She is tolerating p.o. intake.  Will DC home with symptomatic management.  Patient does not meet the SIRS or Sepsis criteria.  On repeat exam patient does not have a surgical abdomin and there are no peritoneal signs.  No indication of appendicitis, bowel obstruction, bowel perforation, cholecystitis, diverticulitis, torsion, PID or ectopic pregnancy.   The patient has been appropriately medically screened and/or stabilized in the ED. I have low suspicion for any other emergent medical condition which would require further screening, evaluation  or treatment in the ED or require inpatient management.  Patient is hemodynamically stable and in no acute distress.  Patient able to ambulate in department prior to ED.  Evaluation does not show acute pathology that would require ongoing or additional emergent interventions while in the emergency department or further inpatient treatment.  I have discussed the diagnosis with the patient and answered all questions.  Pain is been managed while in the emergency department and patient has no further complaints prior to discharge.  Patient is comfortable with plan discussed in room and is stable for discharge at this time.  I have discussed strict return precautions for returning to the emergency department.  Patient was encouraged to follow-up with PCP/specialist refer to at discharge.     MDM Rules/Calculators/A&P                       Final Clinical Impression(s) / ED Diagnoses Final diagnoses:  Abdominal cramping    Rx / DC Orders ED Discharge Orders         Ordered    ondansetron (ZOFRAN ODT) 4 MG disintegrating tablet  Every 8 hours PRN     07/28/19 1746           Edwen Mclester A, PA-C 07/28/19 1749    Arby Barrette, MD 07/28/19 1831

## 2019-07-28 NOTE — ED Triage Notes (Addendum)
Abdominal pain. Menses started at work today. She has a hx of severe menstrual cramps. She has missed a few birth control pills.

## 2019-07-28 NOTE — Discharge Instructions (Signed)
Take Tylenol and ibuprofen.  I have written you nausea medicine, Zofran.  Use heating pad to lower abdomen.  Return for any worsening symptoms.

## 2019-07-30 LAB — URINE CULTURE: Culture: 30000 — AB

## 2019-07-31 LAB — GC/CHLAMYDIA PROBE AMP (~~LOC~~) NOT AT ARMC
Chlamydia: NEGATIVE
Comment: NEGATIVE
Comment: NORMAL
Neisseria Gonorrhea: NEGATIVE

## 2021-01-05 ENCOUNTER — Other Ambulatory Visit: Payer: Self-pay

## 2021-01-05 ENCOUNTER — Encounter (HOSPITAL_BASED_OUTPATIENT_CLINIC_OR_DEPARTMENT_OTHER): Payer: Self-pay | Admitting: Emergency Medicine

## 2021-01-05 ENCOUNTER — Emergency Department (HOSPITAL_BASED_OUTPATIENT_CLINIC_OR_DEPARTMENT_OTHER)
Admission: EM | Admit: 2021-01-05 | Discharge: 2021-01-05 | Disposition: A | Discharge status: Home / Self Care | Payer: BC Managed Care – PPO | Attending: Emergency Medicine | Admitting: Emergency Medicine

## 2021-01-05 DIAGNOSIS — L245 Irritant contact dermatitis due to other chemical products: Secondary | ICD-10-CM

## 2021-01-05 DIAGNOSIS — L232 Allergic contact dermatitis due to cosmetics: Secondary | ICD-10-CM | POA: Insufficient documentation

## 2021-01-05 DIAGNOSIS — Z79899 Other long term (current) drug therapy: Secondary | ICD-10-CM | POA: Insufficient documentation

## 2021-01-05 DIAGNOSIS — Z791 Long term (current) use of non-steroidal anti-inflammatories (NSAID): Secondary | ICD-10-CM | POA: Insufficient documentation

## 2021-01-05 HISTORY — DX: Prediabetes: R73.03

## 2021-01-05 MED ORDER — TRIAMCINOLONE ACETONIDE 0.1 % EX CREA
1.0000 | TOPICAL_CREAM | Freq: Two times a day (BID) | CUTANEOUS | 0 refills | Status: AC
Start: 2021-01-05 — End: ?

## 2021-01-05 MED ORDER — PREDNISONE 10 MG PO TABS: 40.0000 mg | ORAL_TABLET | Freq: Every day | ORAL | 0 refills | Status: AC

## 2021-01-05 NOTE — ED Triage Notes (Signed)
Pt reports allergic reaction after using new hair product x 3 weeks.

## 2021-01-05 NOTE — ED Provider Notes (Signed)
[facility] EMERGENCY DEPARTMENT Provider Note   CSN: [id number] Arrival date & time: 01/05/21  5456     History Chief Complaint  Patient presents with   Allergic Reaction    Allison Briggs is a 30 y.o. female.  Patient with 1 month of rash.  States that she started using a hair serum about 1 month ago and started having itchiness and redness throughout her scalp.  This is spread throughout her entire body various locations.  Sometimes certain clothes are triggering development of this rash.  States she has not changed any laundry detergent.  She has stopped using the hair serum, however symptoms have persisted.  She has not started taking any new medications or using any new skin products.  She has never had problems with skin allergies in the past.  She has tried Benadryl for 1 week with no relief.  She also has tried hydrocortisone ointment with no relief as well.  Denies any shortness of breath, difficulty passing air, hives, chest pain, nausea, vomiting, fevers.   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   Dysmenorrhea 10/20/2013   Vaginal discharge 10/20/2013   Routine screening for STI (sexually transmitted infection) 10/20/2013   Dyspareunia 10/20/2013    Past Surgical History:  Procedure Laterality Date   CERVICAL BIOPSY  2016     OB History     Gravida  1   Para  0   Term  0   Preterm  0   AB  1    Living  0      SAB  1   IAB  0   Ectopic  0   Multiple  0   Live Births              Family History  Problem Relation Age of Onset   Hypertension Maternal Grandmother    Diabetes Maternal Grandmother    Hypertension Paternal Grandmother    Diabetes Other        great uncle maternal    Social History   Tobacco Use   Smoking status: Never   Smokeless tobacco: Never  Vaping Use   Vaping Use: Never used  Substance Use Topics   Alcohol use: No    Alcohol/week: 0.0 standard drinks   Drug use: Yes    Types: Marijuana    Home Medications Prior to Admission medications   Medication Sig Start Date End Date Taking? Authorizing Provider  predniSONE (DELTASONE) 10 MG tablet Take 4 tablets (40 mg total) by mouth daily for 5 days. 01/05/21 01/10/21 Yes Skiler Olden, Finis Bud, PA-C  triamcinolone cream (KENALOG) 0.1 % Apply 1 application topically 2 (two) times daily. 01/05/21  Yes Kaylene Dawn, Finis Bud, PA-C  cetirizine (ZYRTEC) 10 MG tablet Take 1 tablet (10 mg total) by mouth daily. 06/25/16   Trena Platt D, PA  diphenhydrAMINE (BENADRYL) 25 mg capsule Take 25 mg by mouth every 6 (six)  hours as needed for allergies.    [provider]  fluconazole (DIFLUCAN) 150 MG tablet One tab today, and one tab in 5 days 04/05/19   Royal Hawthorn, NP  hydrOXYzine (ATARAX/VISTARIL) 25 MG tablet Take 0.5-1 tablets (12.5-25 mg total) by mouth every 8 (eight) hours as needed for itching. 06/25/16   Trena Platt D, PA  ibuprofen (ADVIL) 800 MG tablet Take 800 mg by mouth every 8 (eight) hours as needed for pain. 09/20/18   [provider]  norelgestromin-ethinyl estradiol (ORTHO EVRA) 150-35 MCG/24HR transdermal patch Place 1 patch onto the skin once a week. 04/14/17   Doristine Bosworth, MD  norgestimate-ethinyl estradiol (ORTHO-CYCLEN,SPRINTEC,PREVIFEM) 0.25-35 MG-MCG tablet Take 1 tablet by mouth daily. 12/27/14   Constant, Peggy, MD  ondansetron (ZOFRAN ODT) 4 MG  disintegrating tablet Take 1 tablet (4 mg total) by mouth every 8 (eight) hours as needed for nausea or vomiting. 07/28/19   Henderly, Britni A, PA-C  oxyCODONE-acetaminophen (PERCOCET/ROXICET) 5-325 MG tablet Take 1 tablet by mouth every 8 (eight) hours as needed for severe pain. 09/26/18   Khatri, Hina, PA-C  promethazine (PHENERGAN) 25 MG tablet Take 25 mg by mouth every 4 (four) hours as needed for nausea/vomiting. 09/20/18   [provider]  triamcinolone (NASACORT) 55 MCG/ACT AERO nasal inhaler Place 2 sprays into the nose daily. 10/19/16   Isa Rankin, MD    Allergies    Latex  Review of Systems   Review of Systems  Constitutional:  Negative for chills and fever.  HENT:  Negative for ear pain and sore throat.   Eyes:  Negative for pain and visual disturbance.  Respiratory:  Negative for cough and shortness of breath.   Cardiovascular:  Negative for chest pain and palpitations.  Gastrointestinal:  Negative for abdominal pain and vomiting.  Genitourinary:  Negative for dysuria and hematuria.  Musculoskeletal:  Negative for arthralgias and back pain.  Skin:  Positive for rash. Negative for color change.  Neurological:  Negative for seizures and syncope.  All other systems reviewed and are negative.  Physical Exam Updated Vital Signs BP 121/89 (BP Location: Left Arm)   Pulse 80   Temp 98.8 F (37.1 C) (Oral)   Resp 18   Ht 5\' 9"  (1.753 m)   Wt 67.1 kg   LMP 12/22/2020   SpO2 100%   BMI 21.86 kg/m   Physical Exam Vitals and nursing note reviewed.  Constitutional:      General: She is not in acute distress.    Appearance: Normal appearance. She is well-developed. She is not ill-appearing, toxic-appearing or diaphoretic.  HENT:     Head: Normocephalic and atraumatic.     Nose: No nasal deformity.     Mouth/Throat:     Lips: Pink. No lesions.  Eyes:     General: Gaze aligned appropriately. No scleral icterus.       Right eye: No discharge.        Left eye:  No discharge.     Conjunctiva/sclera: Conjunctivae normal.     Right eye: Right conjunctiva is not injected. No exudate or hemorrhage.    Left eye: Left conjunctiva is not injected. No exudate or hemorrhage. Pulmonary:     Effort: Pulmonary effort is normal. No respiratory distress.  Skin:    General: Skin is warm and dry.     Comments: Exam and scalp, forehead, bilateral shoulders, torso, back, bilateral lower legs with some minimal erythema.  Rashes diffuse and in various patches  throughout body.  No raised areas or urticaria.  Patient describes it as itchy.  No lesions with drainage. No Signs of infection.  Neurological:     Mental Status: She is alert and oriented to person, place, and time.  Psychiatric:        Mood and Affect: Mood normal.        Speech: Speech normal.        Behavior: Behavior normal. Behavior is cooperative.    ED Results / Procedures / Treatments   Labs (all labs ordered are listed, but only abnormal results are displayed) Labs Reviewed - No data to display  EKG None  Radiology No results found.  Procedures Procedures   Medications Ordered in ED Medications - No data to display  ED Course  I have reviewed the triage vital signs and the nursing notes.  Pertinent labs & imaging results that were available during my care of the patient were reviewed by me and considered in my medical decision making (see chart for details).  Clinical Course as of 01/05/21 1522  Sun Jan 05, 2021  1418 Irritant contact dermatitis, tx with kenalog and pred course [GL]  1419 Derm referral  [GL]    Clinical Course User Index [GL] Aaditya Letizia, Finis Bud, PA-C   MDM Rules/Calculators/A&P                         This is a 30 y.o. female who presents to the ED with 1 month of rash that is associated with hair products and contact with other new objects.  No new medications.  Vitals stable  Patient is well appearing and in no acute distress. Exam with minimal erythema no  areas of body.  Does not look like hives.  Is not consistent with Stevens-Johnson's or TEN.  It is described to be itchy.  It is on her scalp, torso, extremities, face.  Refractory to Benadryl.  I think this is consistent with contact irritant dermatitis due to new products.  Dispo Plan: Short course of oral steroid trial given scalp and facial involvement. Also trial of Kenalog ointment. Derm referral if no improvement.  Recommend changing sheets at home, washing any clothing that has been in contact with suspected irritant.  Change to unscented products.  Stop using products that are suspect to be causing symptoms.  Return precautions given.  Stable for discharge.  Portions of this note were generated with Scientist, clinical (histocompatibility and immunogenetics). Dictation errors may occur despite best attempts at proofreading.   Final Clinical Impression(s) / ED Diagnoses Final diagnoses:  Irritant contact dermatitis due to other chemical products    Rx / DC Orders ED Discharge Orders          Ordered    triamcinolone cream (KENALOG) 0.1 %  2 times daily        01/05/21 1408    predniSONE (DELTASONE) 10 MG tablet  Daily        01/05/21 1408             Sha Burling, Finis Bud, PA-C 01/05/21 1522    Rolan Bucco, MD 01/05/21 1534

## 2021-01-05 NOTE — Discharge Instructions (Addendum)
You have been prescribe 5 days of prednisone treatment.  This medication should not interfere with birth control.  You can also try to put Kenalog ointment over areas that are affected. Stop using any products that are exasperating symptoms. Change sheets and wash any clothes that may have been exposed to irritant. You can schedule an appointment with a dermatologist if you continue to experience symptoms. I have a contact information for a local dermatologist included in discharge paperwork.

## 2021-02-05 ENCOUNTER — Other Ambulatory Visit: Payer: Self-pay

## 2021-02-05 ENCOUNTER — Emergency Department (HOSPITAL_BASED_OUTPATIENT_CLINIC_OR_DEPARTMENT_OTHER)
Admission: EM | Admit: 2021-02-05 | Discharge: 2021-02-05 | Disposition: A | Discharge status: Home / Self Care | Payer: BC Managed Care – PPO | Attending: Emergency Medicine | Admitting: Emergency Medicine

## 2021-02-05 ENCOUNTER — Emergency Department (HOSPITAL_BASED_OUTPATIENT_CLINIC_OR_DEPARTMENT_OTHER): Payer: BC Managed Care – PPO

## 2021-02-05 ENCOUNTER — Encounter (HOSPITAL_BASED_OUTPATIENT_CLINIC_OR_DEPARTMENT_OTHER): Payer: Self-pay

## 2021-02-05 DIAGNOSIS — K5900 Constipation, unspecified: Secondary | ICD-10-CM | POA: Insufficient documentation

## 2021-02-05 DIAGNOSIS — Z91040 Latex allergy status: Secondary | ICD-10-CM | POA: Insufficient documentation

## 2021-02-05 DIAGNOSIS — N938 Other specified abnormal uterine and vaginal bleeding: Secondary | ICD-10-CM | POA: Insufficient documentation

## 2021-02-05 LAB — URINALYSIS, MICROSCOPIC (REFLEX)

## 2021-02-05 LAB — CBC WITH DIFFERENTIAL/PLATELET
Abs Immature Granulocytes: 0.02 10*3/uL (ref 0.00–0.07)
Basophils Absolute: 0 10*3/uL (ref 0.0–0.1)
Basophils Relative: 0 %
Eosinophils Absolute: 0.2 10*3/uL (ref 0.0–0.5)
Eosinophils Relative: 2 %
HCT: 34.4 % — ABNORMAL LOW (ref 36.0–46.0)
Hemoglobin: 11.1 g/dL — ABNORMAL LOW (ref 12.0–15.0)
Immature Granulocytes: 0 %
Lymphocytes Relative: 32 %
Lymphs Abs: 2.1 10*3/uL (ref 0.7–4.0)
MCH: 29.1 pg (ref 26.0–34.0)
MCHC: 32.3 g/dL (ref 30.0–36.0)
MCV: 90.1 fL (ref 80.0–100.0)
Monocytes Absolute: 0.6 10*3/uL (ref 0.1–1.0)
Monocytes Relative: 9 %
Neutro Abs: 3.7 10*3/uL (ref 1.7–7.7)
Neutrophils Relative %: 57 %
Platelets: 267 10*3/uL (ref 150–400)
RBC: 3.82 MIL/uL — ABNORMAL LOW (ref 3.87–5.11)
RDW: 14.6 % (ref 11.5–15.5)
WBC: 6.6 10*3/uL (ref 4.0–10.5)
nRBC: 0 % (ref 0.0–0.2)

## 2021-02-05 LAB — URINALYSIS, ROUTINE W REFLEX MICROSCOPIC
Bilirubin Urine: NEGATIVE
Glucose, UA: NEGATIVE mg/dL
Ketones, ur: NEGATIVE mg/dL
Leukocytes,Ua: NEGATIVE
Nitrite: NEGATIVE
Protein, ur: NEGATIVE mg/dL
Specific Gravity, Urine: >=1.03 (ref 1.005–1.030)
pH: 6 (ref 5.0–8.0)

## 2021-02-05 LAB — COMPREHENSIVE METABOLIC PANEL
ALT: 11 U/L (ref 0–44)
AST: 22 U/L (ref 15–41)
Albumin: 4.5 g/dL (ref 3.5–5.0)
Alkaline Phosphatase: 42 U/L (ref 38–126)
Anion gap: 5 (ref 5–15)
BUN: 13 mg/dL (ref 6–20)
CO2: 28 mmol/L (ref 22–32)
Calcium: 9.4 mg/dL (ref 8.9–10.3)
Chloride: 105 mmol/L (ref 98–111)
Creatinine, Ser: 0.83 mg/dL (ref 0.44–1.00)
GFR, Estimated: >60 mL/min (ref >60)
Glucose, Bld: 106 mg/dL — ABNORMAL HIGH (ref 70–99)
Potassium: 3.9 mmol/L (ref 3.5–5.1)
Sodium: 138 mmol/L (ref 135–145)
Total Bilirubin: 0.6 mg/dL (ref 0.3–1.2)
Total Protein: 7.5 g/dL (ref 6.5–8.1)

## 2021-02-05 LAB — PREGNANCY, URINE: Preg Test, Ur: NEGATIVE

## 2021-02-05 LAB — LIPASE, BLOOD: Lipase: 37 U/L (ref 11–51)

## 2021-02-05 MED ORDER — IOHEXOL 300 MG/ML  SOLN
100.0000 mL | Freq: Once | INTRAMUSCULAR | Status: AC | PRN
  Administered 2021-02-05: 100 mL via INTRAVENOUS

## 2021-02-05 MED ORDER — POLYETHYLENE GLYCOL 3350 17 G PO PACK: 17.0000 g | PACK | Freq: Every day | ORAL | 0 refills | Status: AC

## 2021-02-05 NOTE — ED Triage Notes (Signed)
Pt presents to ED from home C/O L sided pelvic pain, spotting since this AM. Pt reports there is a chance that she could be pregnant. Negative pregnancy test at home yesterday.

## 2021-02-05 NOTE — ED Provider Notes (Signed)
[place] EMERGENCY DEPARTMENT Provider Note   CSN: [id number] Arrival date & time: 02/05/21  0827     History Chief Complaint  Patient presents with   Pelvic Pain    Allison Briggs is a 30 y.o. female.  Patient with a complaint of left lower quadrant abdominal pain on and off for a month.  Started with some vaginal spotting yesterday.  Patient is on birth control pills.  Patient had a home pregnancy test that was negative.  Initial concerns was whether she was pregnant or not.  And her secondary concern is why she having this left lower quadrant abdominal pain.  Patient does not have a primary care doctor.  No nausea vomiting or diarrhea.      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   Dysmenorrhea 10/20/2013   Vaginal discharge 10/20/2013   Routine screening for STI (sexually transmitted infection) 10/20/2013   Dyspareunia 10/20/2013    Past Surgical History:  Procedure Laterality Date   CERVICAL BIOPSY  2016     OB History     Gravida  1   Para  0   Term  0   Preterm  0   AB  1   Living  0      SAB  1   IAB  0   Ectopic  0   Multiple  0   Live Births              Family History  Problem Relation Age of Onset   Hypertension Maternal Grandmother    Diabetes Maternal Grandmother    Hypertension Paternal Grandmother    Diabetes Other        great uncle maternal    Social History   Tobacco Use   Smoking status: Never   Smokeless  tobacco: Never  Vaping Use   Vaping Use: Never used  Substance Use Topics   Alcohol use: No    Alcohol/week: 0.0 standard drinks   Drug use: Yes    Types: Marijuana    Home Medications Prior to Admission medications   Medication Sig Start Date End Date Taking? Authorizing Provider  polyethylene glycol (MIRALAX) 17 g packet Take 17 g by mouth daily. 02/05/21  Yes Fredia Sorrow, MD  cetirizine (ZYRTEC) 10 MG tablet Take 1 tablet (10 mg total) by mouth daily. 06/25/16   Ivar Drape D, PA  diphenhydrAMINE (BENADRYL) 25 mg capsule Take 25 mg by mouth every 6 (six) hours as needed for allergies.    [provider]  fluconazole (DIFLUCAN) 150 MG tablet One tab today, and one tab in 5 days 04/05/19   Wendall Mola, NP  hydrOXYzine (ATARAX/VISTARIL) 25 MG tablet Take 0.5-1 tablets (12.5-25 mg total) by mouth every 8 (eight) hours as needed for itching. 06/25/16   Ivar Drape D, PA  ibuprofen (ADVIL) 800 MG tablet Take 800 mg by mouth every 8 (eight) hours as needed for pain. 09/20/18   [provider]  norelgestromin-ethinyl  estradiol (ORTHO EVRA) 150-35 MCG/24HR transdermal patch Place 1 patch onto the skin once a week. 04/14/17   Doristine Bosworth, MD  norgestimate-ethinyl estradiol (ORTHO-CYCLEN,SPRINTEC,PREVIFEM) 0.25-35 MG-MCG tablet Take 1 tablet by mouth daily. 12/27/14   Constant, Peggy, MD  ondansetron (ZOFRAN ODT) 4 MG disintegrating tablet Take 1 tablet (4 mg total) by mouth every 8 (eight) hours as needed for nausea or vomiting. 07/28/19   Henderly, Britni A, PA-C  oxyCODONE-acetaminophen (PERCOCET/ROXICET) 5-325 MG tablet Take 1 tablet by mouth every 8 (eight) hours as needed for severe pain. 09/26/18   Khatri, Hina, PA-C  promethazine (PHENERGAN) 25 MG tablet Take 25 mg by mouth every 4 (four) hours as needed for nausea/vomiting. 09/20/18   [provider]  triamcinolone (NASACORT) 55 MCG/ACT AERO nasal inhaler Place 2 sprays into the nose daily.  10/19/16   Isa Rankin, MD  triamcinolone cream (KENALOG) 0.1 % Apply 1 application topically 2 (two) times daily. 01/05/21   Loeffler, Finis Bud, PA-C    Allergies    Latex  Review of Systems   Review of Systems  Constitutional:  Negative for chills and fever.  HENT:  Negative for ear pain and sore throat.   Eyes:  Negative for pain and visual disturbance.  Respiratory:  Negative for cough and shortness of breath.   Cardiovascular:  Negative for chest pain and palpitations.  Gastrointestinal:  Positive for abdominal pain. Negative for vomiting.  Genitourinary:  Positive for vaginal bleeding. Negative for dysuria and hematuria.  Musculoskeletal:  Negative for arthralgias and back pain.  Skin:  Negative for color change and rash.  Neurological:  Negative for seizures and syncope.  All other systems reviewed and are negative.  Physical Exam Updated Vital Signs BP 119/62 (BP Location: Right Arm)   Pulse 80   Temp 98.1 F (36.7 C) (Oral)   Resp 16   Ht 1.753 m (5\' 9" )   Wt 67.1 kg   LMP 01/23/2021   SpO2 100%   BMI 21.86 kg/m   Physical Exam Vitals and nursing note reviewed.  Constitutional:      General: She is not in acute distress.    Appearance: Normal appearance. She is well-developed.  HENT:     Head: Normocephalic and atraumatic.  Eyes:     Conjunctiva/sclera: Conjunctivae normal.     Pupils: Pupils are equal, round, and reactive to light.  Cardiovascular:     Rate and Rhythm: Normal rate and regular rhythm.     Heart sounds: No murmur heard. Pulmonary:     Effort: Pulmonary effort is normal. No respiratory distress.     Breath sounds: Normal breath sounds.  Abdominal:     Palpations: Abdomen is soft.     Tenderness: There is no abdominal tenderness.  Musculoskeletal:        General: No swelling.     Cervical back: Normal range of motion and neck supple.  Skin:    General: Skin is warm and dry.     Capillary Refill: Capillary refill takes less than 2  seconds.  Neurological:     General: No focal deficit present.     Mental Status: She is alert and oriented to person, place, and time.     Cranial Nerves: No cranial nerve deficit.     Sensory: No sensory deficit.     Motor: No weakness.  Psychiatric:        Mood and Affect: Mood normal.    ED Results / Procedures / Treatments  Labs (all labs ordered are listed, but only abnormal results are displayed) Labs Reviewed  URINALYSIS, ROUTINE W REFLEX MICROSCOPIC - Abnormal; Notable for the following components:      Result Value   Hgb urine dipstick MODERATE (*)    All other components within normal limits  URINALYSIS, MICROSCOPIC (REFLEX) - Abnormal; Notable for the following components:   Bacteria, UA RARE (*)    All other components within normal limits  COMPREHENSIVE METABOLIC PANEL - Abnormal; Notable for the following components:   Glucose, Bld 106 (*)    All other components within normal limits  CBC WITH DIFFERENTIAL/PLATELET - Abnormal; Notable for the following components:   RBC 3.82 (*)    Hemoglobin 11.1 (*)    HCT 34.4 (*)    All other components within normal limits  PREGNANCY, URINE  LIPASE, BLOOD    EKG None  Radiology CT Abdomen Pelvis W Contrast  Result Date: 02/05/2021 CLINICAL DATA:  One month of intermittent left lower quadrant pain with diarrhea and constipation EXAM: CT ABDOMEN AND PELVIS WITH CONTRAST TECHNIQUE: Multidetector CT imaging of the abdomen and pelvis was performed using the standard protocol following bolus administration of intravenous contrast. CONTRAST:  168mL OMNIPAQUE IOHEXOL 300 MG/ML  SOLN COMPARISON:  None similar FINDINGS: Lower chest:  No contributory findings. Hepatobiliary: No focal liver abnormality.No evidence of biliary obstruction or stone. Pancreas: Unremarkable. Spleen: Unremarkable. Adrenals/Urinary Tract: Negative adrenals. No hydronephrosis or stone. Unremarkable bladder. Stomach/Bowel: No obstruction. Diffuse formed  stool. Moderate distension of the stomach suggesting recent meal. Vascular/Lymphatic: No acute vascular abnormality. No mass or adenopathy. Reproductive:Cystic densities in the bilateral ovary measuring up to 4 cm on the right No follow-up imaging recommended. Note: This recommendation does not apply to premenarchal patients and to those with increased risk (genetic, family history, elevated tumor markers or other high-risk factors) of ovarian cancer. Reference: JACR 2020 Feb; 17(2):248-254 Other: No ascites or pneumoperitoneum. Musculoskeletal: No acute abnormalities. IMPRESSION: Generalized colonic stool, please correlate for constipation. No bowel obstruction or inflammation. Electronically Signed   By: Jorje Guild M.D.   On: 02/05/2021 10:11    Procedures Procedures   Medications Ordered in ED Medications  iohexol (OMNIPAQUE) 300 MG/ML solution 100 mL (100 mLs Intravenous Contrast Given 02/05/21 0941)    ED Course  I have reviewed the triage vital signs and the nursing notes.  Pertinent labs & imaging results that were available during my care of the patient were reviewed by me and considered in my medical decision making (see chart for details).    MDM Rules/Calculators/A&P                           Patient's pregnancy test is negative.  Discussion with patient concern for STD but that was based on the month-long left lower quadrant abdominal pain which would not fit into the clinical Scenario.  There is no vaginal discharge.  We will go and do the work-up of the left lower quadrant abdominal pain that is been ongoing for a month.  Will get labs and CT abdomen pelvis.  Urinalysis negative except for blood in the urine which would go along with the vaginal spotting.  Is without any acute findings.  CT scan does show a degree of constipation.  No signs of obstruction.  Also showed evidence of a right ovarian cyst.  Nothing on the left side.  We will treat patient with  MiraLAX.   Final Clinical Impression(s) / ED  Diagnoses Final diagnoses:  Constipation, unspecified constipation type    Rx / DC Orders ED Discharge Orders          Ordered    polyethylene glycol (MIRALAX) 17 g packet  Daily        02/05/21 1033             Fredia Sorrow, MD 02/05/21 [phone]

## 2021-02-05 NOTE — ED Notes (Addendum)
Assumed care from [place] RN. Patient laying quietly on gurney. Urine collected and sent. No acute distress noted. Patient updated on plan of care. Will continue to monitor.  0900: Report given to Almira Coaster RN for continuation of care.

## 2021-02-05 NOTE — Discharge Instructions (Addendum)
CT scan of the abdomen consistent with some degree of constipation.  No evidence of any obstruction.  There also was evidence of a right ovarian cyst.  But nothing on the left side.  Take the MiraLAX as directed.  Return for any new or worse symptoms.  MiraLAX is also available over-the-counter.

## 2021-02-05 NOTE — ED Notes (Signed)
No acute distress noted upon this RN's departure of patient. Verified discharge paperwork with name and DOB. Vital signs stable. Patient taken to checkout window. Discharge paperwork discussed with patient. No further questions voiced upon discharge.  ° °

## 2021-09-16 ENCOUNTER — Encounter (HOSPITAL_BASED_OUTPATIENT_CLINIC_OR_DEPARTMENT_OTHER): Payer: Self-pay

## 2021-09-16 ENCOUNTER — Emergency Department (HOSPITAL_BASED_OUTPATIENT_CLINIC_OR_DEPARTMENT_OTHER)
Admission: EM | Admit: 2021-09-16 | Discharge: 2021-09-16 | Disposition: A | Discharge status: Home / Self Care | Payer: BC Managed Care – PPO | Attending: Emergency Medicine | Admitting: Emergency Medicine

## 2021-09-16 DIAGNOSIS — T7840XA Allergy, unspecified, initial encounter: Secondary | ICD-10-CM | POA: Diagnosis present

## 2021-09-16 DIAGNOSIS — L509 Urticaria, unspecified: Secondary | ICD-10-CM | POA: Diagnosis not present

## 2021-09-16 LAB — PREGNANCY, URINE: Preg Test, Ur: NEGATIVE

## 2021-09-16 MED ORDER — DIPHENHYDRAMINE HCL 25 MG PO TABS: 25.0000 mg | ORAL_TABLET | Freq: Four times a day (QID) | ORAL | 0 refills | Status: AC

## 2021-09-16 MED ORDER — FAMOTIDINE 20 MG PO TABS
20.0000 mg | ORAL_TABLET | Freq: Once | ORAL | Status: AC
  Administered 2021-09-16: 20 mg via ORAL
  Filled 2021-09-16: qty 1

## 2021-09-16 MED ORDER — FAMOTIDINE 20 MG PO TABS: 20.0000 mg | ORAL_TABLET | Freq: Two times a day (BID) | ORAL | 0 refills | Status: DC

## 2021-09-16 MED ORDER — DIPHENHYDRAMINE HCL 25 MG PO CAPS
25.0000 mg | ORAL_CAPSULE | Freq: Once | ORAL | Status: AC
  Administered 2021-09-16: 25 mg via ORAL
  Filled 2021-09-16: qty 1

## 2021-09-16 MED ORDER — PREDNISONE 50 MG PO TABS
60.0000 mg | ORAL_TABLET | Freq: Once | ORAL | Status: AC
  Administered 2021-09-16: 60 mg via ORAL
  Filled 2021-09-16: qty 1

## 2021-09-16 NOTE — Discharge Instructions (Signed)
You were evaluated in the Emergency Department and after careful evaluation, we did not find any emergent condition requiring admission or further testing in the hospital.  Your exam/testing today is overall reassuring.  Symptoms seem to be due to an allergic reaction.  Recommend avoiding the new hair product.  Use the Benadryl every 4-6 hours as needed for rash or itching.  Also recommend using the Pepcid every 12 hours as needed for the same.  Recommend follow-up with the allergy specialist.  Please return to the Emergency Department if you experience any worsening of your condition.   Thank you for allowing [place] to be a part of your care.

## 2021-09-16 NOTE — ED Triage Notes (Signed)
Pt reports allergic reaction that started yesterday. Pt noted to have scattered whelts to arms, neck area and feet. Reports using a different shampoo and the symptoms started approx 2 late

## 2021-09-16 NOTE — ED Provider Notes (Signed)
[facility]EM[facility]GENCY DEPT [facility] Emergency Department Provider Note MRN:  [id number]  Arrival date & time: 09/16/21     Chief Complaint   Allergic Reaction   History of Present Illness   Allison Briggs is a 31 y.o. year-old female with no pertinent past medical history presenting to the ED with chief complaint of allergic reaction.  Patient tried a new hair product yesterday and then in the afternoon she began having hives.  Hives have continued and have been persistent since this morning.  Itchy.  No facial swelling or throat swelling or tongue swelling, no nausea vomiting or diarrhea, no other complaints.  Review of Systems  A thorough review of systems was obtained and all systems are negative except as noted in the HPI and PMH.   Patient's Health History    Past Medical History:  Diagnosis Date   Anemia    Borderline diabetes    BV (bacterial vaginosis)    09/15/13   STD (sexually transmitted disease)    h/o chlamydia, RRP   Trichimoniasis    Vaginal Pap smear, abnormal     Past Surgical History:  Procedure Laterality Date   CERVICAL BIOPSY  2016    Family History  Problem Relation Age of Onset   Hypertension Maternal Grandmother    Diabetes Maternal Grandmother    Hypertension Paternal Grandmother    Diabetes Other        great uncle maternal    Social History   Socioeconomic History   Marital status: Single    Spouse name: Not on file   Number of children: Not on file   Years of education: Not on file   Highest education level: Not on file  Occupational History   Not on file  Tobacco Use   Smoking status: Never   Smokeless tobacco: Never  Vaping Use   Vaping Use: Never used  Substance and Sexual Activity   Alcohol use: No    Alcohol/week: 0.0 standard drinks of alcohol   Drug use: Yes    Types: Marijuana   Sexual activity: Yes    Birth control/protection: Pill  Other Topics Concern   Not on file  Social History Narrative   Not on file    Social Determinants of Health   Financial Resource Strain: Not on file  Food Insecurity: Not on file  Transportation Needs: Not on file  Physical Activity: Not on file  Stress: Not on file  Social Connections: Not on file  Intimate Partner Violence: Not on file     Physical Exam   Vitals:   09/16/21 0944 09/16/21 0945  BP: (!) 135/57   Pulse: 76 84  Resp: 16   Temp:  98 F (36.7 C)  SpO2: 97% 96%    CONSTITUTIONAL: Well-appearing, NAD NEURO/PSYCH:  Alert and oriented x 3, no focal deficits EYES:  eyes equal and reactive ENT/NECK:  no LAD, no JVD CARDIO: Regular rate, well-perfused, normal S1 and S2 PULM:  CTAB no wheezing or rhonchi GI/GU:  non-distended, non-tender MSK/SPINE:  No gross deformities, no edema SKIN: Urticarial rash to the trunk, extremities   *Additional and/or pertinent findings included in MDM below  Diagnostic and Interventional Summary    EKG Interpretation  Date/Time:    Ventricular Rate:    PR Interval:    QRS Duration:   QT Interval:    QTC Calculation:   R Axis:     Text Interpretation:         Labs Reviewed  PREGNANCY,  URINE    No orders to display    Medications  predniSONE (DELTASONE) tablet 60 mg (has no administration in time range)  diphenhydrAMINE (BENADRYL) capsule 25 mg (has no administration in time range)  famotidine (PEPCID) tablet 20 mg (has no administration in time range)     Procedures  /  Critical Care Procedures  ED Course and Medical Decision Making  Initial Impression and Ddx History and exam consistent with mild allergic reaction, providing steroids, antihistamines, appropriate for discharge.  Past medical/surgical history that increases complexity of ED encounter: None  Interpretation of Diagnostics Laboratory and/or imaging options to aid in the diagnosis/care of the patient were considered.  After careful history and physical examination, it was determined that there was no indication for  diagnostics at this time.  Patient Reassessment and Ultimate Disposition/Management     Discharge  Patient management required discussion with the following services or consulting groups:  None  Complexity of Problems Addressed Acute illness or injury that poses threat of life of bodily function  Additional Data Reviewed and Analyzed Further history obtained from: None  Additional Factors Impacting ED Encounter Risk Prescriptions  Elmer Sow. Pilar Plate, MD [facility] Emergency Medicine [facility] [email or web address]  Final Clinical Impressions(s) / ED Diagnoses     ICD-10-CM   1. Allergic reaction, initial encounter  T78.40XA       ED Discharge Orders          Ordered    diphenhydrAMINE (BENADRYL) 25 MG tablet  Every 6 hours        09/16/21 1017    famotidine (PEPCID) 20 MG tablet  2 times daily        09/16/21 1017             Discharge Instructions Discussed with and Provided to Patient:    Discharge Instructions      You were evaluated in the Emergency Department and after careful evaluation, we did not find any emergent condition requiring admission or further testing in the hospital.  Your exam/testing today is overall reassuring.  Symptoms seem to be due to an allergic reaction.  Recommend avoiding the new hair product.  Use the Benadryl every 4-6 hours as needed for rash or itching.  Also recommend using the Pepcid every 12 hours as needed for the same.  Recommend follow-up with the allergy specialist.  Please return to the Emergency Department if you experience any worsening of your condition.   Thank you for allowing [place] to be a part of your care.      Sabas Sous, MD 09/16/21 1019

## 2021-09-16 NOTE — ED Notes (Signed)
Reviewed discharge instructions and recommendations with pt. States understanding. Questions answered. Ambulatory and discharged to home.

## 2022-04-24 ENCOUNTER — Emergency Department (HOSPITAL_BASED_OUTPATIENT_CLINIC_OR_DEPARTMENT_OTHER)
Admission: EM | Admit: 2022-04-24 | Discharge: 2022-04-24 | Disposition: A | Discharge status: Home / Self Care | Payer: Self-pay | Attending: Emergency Medicine | Admitting: Emergency Medicine

## 2022-04-24 ENCOUNTER — Encounter (HOSPITAL_BASED_OUTPATIENT_CLINIC_OR_DEPARTMENT_OTHER): Payer: Self-pay | Admitting: Emergency Medicine

## 2022-04-24 ENCOUNTER — Emergency Department (HOSPITAL_BASED_OUTPATIENT_CLINIC_OR_DEPARTMENT_OTHER): Payer: Self-pay

## 2022-04-24 ENCOUNTER — Other Ambulatory Visit: Payer: Self-pay

## 2022-04-24 DIAGNOSIS — R1032 Left lower quadrant pain: Secondary | ICD-10-CM | POA: Insufficient documentation

## 2022-04-24 DIAGNOSIS — R102 Pelvic and perineal pain: Secondary | ICD-10-CM | POA: Insufficient documentation

## 2022-04-24 DIAGNOSIS — Z91040 Latex allergy status: Secondary | ICD-10-CM | POA: Insufficient documentation

## 2022-04-24 DIAGNOSIS — R1030 Lower abdominal pain, unspecified: Secondary | ICD-10-CM

## 2022-04-24 LAB — BASIC METABOLIC PANEL
Anion gap: 6 (ref 5–15)
BUN: 11 mg/dL (ref 6–20)
CO2: 23 mmol/L (ref 22–32)
Calcium: 8.9 mg/dL (ref 8.9–10.3)
Chloride: 106 mmol/L (ref 98–111)
Creatinine, Ser: 0.91 mg/dL (ref 0.44–1.00)
GFR, Estimated: >60 mL/min (ref >60)
Glucose, Bld: 90 mg/dL (ref 70–99)
Potassium: 4 mmol/L (ref 3.5–5.1)
Sodium: 135 mmol/L (ref 135–145)

## 2022-04-24 LAB — HIV ANTIBODY (ROUTINE TESTING W REFLEX): HIV Screen 4th Generation wRfx: NONREACTIVE

## 2022-04-24 LAB — CBC
HCT: 35.6 % — ABNORMAL LOW (ref 36.0–46.0)
Hemoglobin: 11.3 g/dL — ABNORMAL LOW (ref 12.0–15.0)
MCH: 28.6 pg (ref 26.0–34.0)
MCHC: 31.7 g/dL (ref 30.0–36.0)
MCV: 90.1 fL (ref 80.0–100.0)
Platelets: 276 10*3/uL (ref 150–400)
RBC: 3.95 MIL/uL (ref 3.87–5.11)
RDW: 13.7 % (ref 11.5–15.5)
WBC: 6.1 10*3/uL (ref 4.0–10.5)
nRBC: 0 % (ref 0.0–0.2)

## 2022-04-24 LAB — WET PREP, GENITAL
Clue Cells Wet Prep HPF POC: NONE SEEN
Sperm: NONE SEEN
Trich, Wet Prep: NONE SEEN
WBC, Wet Prep HPF POC: >=10 — AB (ref <10)
Yeast Wet Prep HPF POC: NONE SEEN

## 2022-04-24 LAB — PREGNANCY, URINE: Preg Test, Ur: NEGATIVE

## 2022-04-24 MED ORDER — HYDROCODONE-ACETAMINOPHEN 5-325 MG PO TABS: 1.0000 | ORAL_TABLET | ORAL | 0 refills | Status: AC | PRN

## 2022-04-24 MED ORDER — IBUPROFEN 800 MG PO TABS
800.0000 mg | ORAL_TABLET | Freq: Once | ORAL | Status: AC
  Administered 2022-04-24: 800 mg via ORAL
  Filled 2022-04-24: qty 1

## 2022-04-24 MED ORDER — ACETAMINOPHEN 500 MG PO TABS
1000.0000 mg | ORAL_TABLET | ORAL | Status: AC
  Administered 2022-04-24: 1000 mg via ORAL
  Filled 2022-04-24: qty 2

## 2022-04-24 NOTE — Discharge Instructions (Addendum)
You were seen for your pelvic pain in the emergency department.  You had lab work and an ultrasound that was reassuring.  At home, please take Tylenol and ibuprofen for your pain.  You may take the Norco for any breakthrough pain that you have.  Do not drive or operate heavy machinery after taking this medication.    Check your MyChart online for the results of any tests that had not resulted by the time you left the emergency department.   Follow-up with your OB/GYN doctor in 2-3 days regarding your visit.    Return immediately to the emergency department if you experience any of the following: Worsening pain, fever, or any other concerning symptoms.    Thank you for visiting our Emergency Department. It was a pleasure taking care of you today.

## 2022-04-24 NOTE — ED Triage Notes (Signed)
Left groin  pain x 3 days Hx ovarian cyst 3 years ago , denies urinary symptoms . 7 days treatment of BV.  Denies NV.

## 2022-04-24 NOTE — ED Provider Notes (Signed)
  Physical Exam  BP 102/73   Pulse 74   Temp 98.9 F (37.2 C) (Oral)   Resp 18   Wt 69.9 kg   LMP 04/16/2022 (Exact Date)   SpO2 100%   BMI 22.74 kg/m   Physical Exam  Procedures  Procedures  ED Course / MDM   Clinical Course as of 04/25/22 1323  Fri Apr 24, 2022  1547 Assumed care from Dr Alvino Chapel. Heavy menses since start of the month. Treated for BV earlier in the month. Having some lower abdominal ttp. Did have thick mucus appearing dc. Getting TVUS for TOA. Has not been empirically treated for STD.  [RP]  1723 Pelvic US wnl.  [RP]  5974 Patient reassessed.  Reports that her pain has significantly improved but is still present.  Says that she has been trying Tylenol and ibuprofen at home.  Will give her a short prescription of Norco for her pain.  Did discuss patient's symptoms and she reports that she is not concerned for STIs at this time and declined empiric treatment.  No GI symptoms that would be concerning for diverticulitis.  Will have her follow-up with GYN regarding her bleeding and pelvic pain. [RP]    Clinical Course User Index [RP] Fransico Meadow, MD   Medical Decision Making Amount and/or Complexity of Data Reviewed Labs: ordered. Radiology: ordered.  Risk OTC drugs. Prescription drug management.     Fransico Meadow, MD 04/25/22 1324

## 2022-04-24 NOTE — ED Provider Notes (Signed)
[place] [facility] Provider Note   CSN: [id number] Arrival date & time: 04/24/22  1220     History  Chief Complaint  Patient presents with   Groin Pain    left    Allison Briggs is a 32 y.o. female.   Groin Pain  Patient presents with suprapubic to left lower quadrant pain.  Is had for around the last 3 days.  On her menses.  States menses is often painful for her.  She has been on it for around 9 days.  Last 3 days been painful and more blood clots.  States he will hurt until he passes the last clots.  Also although has been on 7 days of treatment for bacterial vaginosis.  States she has had some itchiness.  States [facility].  States she is on birth control and hope she is not pregnant. states her menses normally hurt on the left side.  States last month she felt very lightheaded.    Past Medical History:  Diagnosis Date   Anemia    Borderline diabetes    BV (bacterial vaginosis)    09/15/13   STD (sexually transmitted disease)    h/o chlamydia, RRP   Trichimoniasis    Vaginal Pap smear, abnormal     Home Medications Prior to Admission medications   Medication Sig Start Date End Date Taking? Authorizing Provider  cetirizine (ZYRTEC) 10 MG tablet Take 1 tablet (10 mg total) by mouth daily. 06/25/16   Joretta Bachelor, PA  diphenhydrAMINE (BENADRYL) 25 MG tablet Take 1 tablet (25 mg total) by mouth every 6 (six) hours. 09/16/21   Maudie Flakes, MD  famotidine (PEPCID) 20 MG tablet Take 1 tablet (20 mg total) by mouth 2 (two) times daily. 09/16/21   Maudie Flakes, MD  fluconazole (DIFLUCAN) 150 MG tablet One tab today, and one tab in 5 days 04/05/19   Wendall Mola, NP  hydrOXYzine (ATARAX/VISTARIL) 25 MG tablet Take 0.5-1 tablets (12.5-25 mg total) by mouth every 8 (eight) hours as needed for itching. 06/25/16   Ivar Drape D, PA  ibuprofen (ADVIL) 800 MG tablet Take 800 mg by mouth every 8  (eight) hours as needed for pain. 09/20/18   [provider]  norelgestromin-ethinyl estradiol (ORTHO EVRA) 150-35 MCG/24HR transdermal patch Place 1 patch onto the skin once a week. 04/14/17   Forrest Moron, MD  norgestimate-ethinyl estradiol (ORTHO-CYCLEN,SPRINTEC,PREVIFEM) 0.25-35 MG-MCG tablet Take 1 tablet by mouth daily. 12/27/14   Constant, Peggy, MD  ondansetron (ZOFRAN ODT) 4 MG disintegrating tablet Take 1 tablet (4 mg total) by mouth every 8 (eight) hours as needed for nausea or vomiting. 07/28/19   Henderly, Britni A, PA-C  oxyCODONE-acetaminophen (PERCOCET/ROXICET) 5-325 MG tablet Take 1 tablet by mouth every 8 (eight) hours as needed for severe pain. 09/26/18   Khatri, Hina, PA-C  polyethylene glycol (MIRALAX) 17 g packet Take 17 g by mouth daily. 02/05/21   Fredia Sorrow, MD  promethazine (PHENERGAN) 25 MG tablet Take 25 mg by mouth every 4 (four) hours as needed for nausea/vomiting. 09/20/18   [provider]  triamcinolone (NASACORT) 55 MCG/ACT AERO nasal inhaler Place 2 sprays into the nose daily. 10/19/16   Wynona Luna, MD  triamcinolone cream (KENALOG) 0.1 % Apply 1 application topically 2 (two) times daily. 01/05/21   Loeffler, Adora Fridge, PA-C      Allergies    Latex    Review  of Systems   Review of Systems  Physical Exam Updated Vital Signs BP 124/84 (BP Location: Left Arm)   Pulse 77   Temp 98.9 F (37.2 C) (Oral)   Resp 16   Wt 69.9 kg   LMP 04/16/2022 (Exact Date)   SpO2 95%   BMI 22.74 kg/m  Physical Exam Vitals and nursing note reviewed.  Cardiovascular:     Rate and Rhythm: Regular rhythm.  Abdominal:     Tenderness: There is no abdominal tenderness.  Skin:    General: Skin is warm.     Capillary Refill: Capillary refill takes less than 2 seconds.     Coloration: Skin is not pale.  Neurological:     Mental Status: She is alert and oriented to person, place, and time.     ED Results / Procedures / Treatments   Labs (all  labs ordered are listed, but only abnormal results are displayed) Labs Reviewed  WET PREP, GENITAL - Abnormal; Notable for the following components:      Result Value   WBC, Wet Prep HPF POC >=10 (*)    All other components within normal limits  CBC - Abnormal; Notable for the following components:   Hemoglobin 11.3 (*)    HCT 35.6 (*)    All other components within normal limits  BASIC METABOLIC PANEL  PREGNANCY, URINE  HIV ANTIBODY (ROUTINE TESTING W REFLEX)  RPR  GC/CHLAMYDIA PROBE AMP ([place]) NOT AT [facility]    EKG None  Radiology No results found.  Procedures Procedures    Medications Ordered in ED Medications - No data to display  ED Course/ Medical Decision Making/ A&P                             Medical Decision Making Amount and/or Complexity of Data Reviewed Labs: ordered.     Patient with lower abdominal/pelvic pain.  On her menses which is often painful for her.  States she is still passing more blood clots.  States she has felt headed to.  Does not know if she is pregnant.  The actual diagnosis includ pain with menses and bleeding. however also potentially could be pathology such as ectopic pregnancy.  Will check pelvic exam.  Is being treated for bacterial vaginosis will do a pelvic exam.  Hemoglobin 11.3 and overall reassuring.  On pelvic exam however does have some discharge that is not bloody and left-sided adnexal tenderness.  I think will require imaging.  However have not been able to get urine yet.  Pregnancy would change her workup some.  Care turned over to Dr. Sharlett Iles.  States          Final Clinical Impression(s) / ED Diagnoses Final diagnoses:  None    Rx / DC Orders ED Discharge Orders     None         Davonna Belling, MD 04/24/22 [phone]

## 2022-04-24 NOTE — ED Notes (Signed)
D/c paperwork reviewed with pt, including prescriptions and follow up care.  All questions and/or concerns addressed at time of d/c.  No further needs expressed. . Pt verbalized understanding, Ambulatory without assistance to ED exit, NAD.

## 2022-04-25 LAB — RPR: RPR Ser Ql: NONREACTIVE

## 2022-04-27 LAB — GC/CHLAMYDIA PROBE AMP (~~LOC~~) NOT AT ARMC
Chlamydia: NEGATIVE
Comment: NEGATIVE
Comment: NORMAL
Neisseria Gonorrhea: NEGATIVE

## 2022-05-05 ENCOUNTER — Other Ambulatory Visit: Payer: Self-pay

## 2022-05-05 ENCOUNTER — Emergency Department (HOSPITAL_COMMUNITY)
Admission: EM | Admit: 2022-05-05 | Discharge: 2022-05-05 | Disposition: A | Discharge status: Home / Self Care | Payer: Self-pay | Attending: Emergency Medicine | Admitting: Emergency Medicine

## 2022-05-05 ENCOUNTER — Emergency Department (HOSPITAL_COMMUNITY): Payer: Self-pay

## 2022-05-05 DIAGNOSIS — T7840XA Allergy, unspecified, initial encounter: Secondary | ICD-10-CM | POA: Insufficient documentation

## 2022-05-05 LAB — CBC
HCT: 33.8 % — ABNORMAL LOW (ref 36.0–46.0)
Hemoglobin: 11.2 g/dL — ABNORMAL LOW (ref 12.0–15.0)
MCH: 29.6 pg (ref 26.0–34.0)
MCHC: 33.1 g/dL (ref 30.0–36.0)
MCV: 89.2 fL (ref 80.0–100.0)
Platelets: 249 10*3/uL (ref 150–400)
RBC: 3.79 MIL/uL — ABNORMAL LOW (ref 3.87–5.11)
RDW: 13.6 % (ref 11.5–15.5)
WBC: 4.3 10*3/uL (ref 4.0–10.5)
nRBC: 0 % (ref 0.0–0.2)

## 2022-05-05 LAB — BASIC METABOLIC PANEL
Anion gap: 9 (ref 5–15)
BUN: 8 mg/dL (ref 6–20)
CO2: 23 mmol/L (ref 22–32)
Calcium: 8.9 mg/dL (ref 8.9–10.3)
Chloride: 104 mmol/L (ref 98–111)
Creatinine, Ser: 0.83 mg/dL (ref 0.44–1.00)
GFR, Estimated: >60 mL/min (ref >60)
Glucose, Bld: 108 mg/dL — ABNORMAL HIGH (ref 70–99)
Potassium: 3.8 mmol/L (ref 3.5–5.1)
Sodium: 136 mmol/L (ref 135–145)

## 2022-05-05 LAB — TROPONIN I (HIGH SENSITIVITY): Troponin I (High Sensitivity): <2 ng/L (ref <18)

## 2022-05-05 LAB — I-STAT BETA HCG BLOOD, ED (MC, WL, AP ONLY): I-stat hCG, quantitative: <5 m[IU]/mL (ref <5)

## 2022-05-05 MED ORDER — ALUM & MAG HYDROXIDE-SIMETH 200-200-20 MG/5ML PO SUSP
30.0000 mL | Freq: Once | ORAL | Status: AC
  Administered 2022-05-05: 30 mL via ORAL
  Filled 2022-05-05: qty 30

## 2022-05-05 MED ORDER — FAMOTIDINE IN NACL 20-0.9 MG/50ML-% IV SOLN
20.0000 mg | Freq: Once | INTRAVENOUS | Status: AC
  Administered 2022-05-05: 20 mg via INTRAVENOUS
  Filled 2022-05-05: qty 50

## 2022-05-05 MED ORDER — SODIUM CHLORIDE 0.9 % IV BOLUS
1000.0000 mL | Freq: Once | INTRAVENOUS | Status: AC
  Administered 2022-05-05: 1000 mL via INTRAVENOUS

## 2022-05-05 MED ORDER — FAMOTIDINE 20 MG PO TABS: 20.0000 mg | ORAL_TABLET | Freq: Two times a day (BID) | ORAL | 0 refills | Status: AC

## 2022-05-05 MED ORDER — PREDNISONE 20 MG PO TABS: 40.0000 mg | ORAL_TABLET | Freq: Every day | ORAL | 0 refills | Status: AC

## 2022-05-05 MED ORDER — METHYLPREDNISOLONE SODIUM SUCC 125 MG IJ SOLR
125.0000 mg | Freq: Once | INTRAMUSCULAR | Status: AC
  Administered 2022-05-05: 125 mg via INTRAVENOUS
  Filled 2022-05-05: qty 2

## 2022-05-05 NOTE — ED Triage Notes (Signed)
Patient reports mid chest pain this evening , no SOB , denies emesis or diaphoresis , she adds allergic reaction with  skin itching at hands onset yesterday .

## 2022-05-05 NOTE — ED Provider Notes (Signed)
[facility] Emergency Department Provider Note MRN:  [id number]  Arrival date & time: 05/05/22     Chief Complaint   Chest Pain   History of Present Illness   Allison Briggs is a 32 y.o. year-old female presents to the ED with chief complaint of chest tightness.  States that she had an allergic reaction a couple of days ago that she has been managing with benadryl.  She states that she still has hives that come and go.  States that she became concerned when she had a couple episodes of chest tightness tonight.  Denies any other associated symptoms.  History provided by patient.   Review of Systems  Pertinent positive and negative review of systems noted in HPI.    Physical Exam   Vitals:   05/05/22 0500 05/05/22 0530  BP: 105/69 113/78  Pulse: 61 73  Resp:    Temp:    SpO2: 100% 100%    CONSTITUTIONAL:  well-appearing, NAD NEURO:  Alert and oriented x 3, CN 3-12 grossly intact EYES:  eyes equal and reactive ENT/NECK:  Supple, no stridor  CARDIO:  normal rate, regular rhythm, appears well-perfused  PULM:  No respiratory distress, no wheezing GI/GU:  non-distended,  MSK/SPINE:  No gross deformities, no edema, moves all extremities  SKIN:  no rash, atraumatic   *Additional and/or pertinent findings included in MDM below  Diagnostic and Interventional Summary    EKG Interpretation  Date/Time:  Tuesday May 05 2022 [id number] EST Ventricular Rate:  73 PR Interval:  170 QRS Duration: 74 QT Interval:  372 QTC Calculation: 409 R Axis:   55 Text Interpretation: Normal sinus rhythm Normal ECG No old tracing to compare Confirmed by Deno Etienne [phone]) on 05/05/2022 3:51:09 AM       Labs Reviewed  BASIC METABOLIC PANEL - Abnormal; Notable for the following components:      Result Value   Glucose, Bld 108 (*)    All other components within normal limits  CBC - Abnormal; Notable for the following components:   RBC 3.79 (*)    Hemoglobin 11.2  (*)    HCT 33.8 (*)    All other components within normal limits  I-STAT BETA HCG BLOOD, ED ([facility], [facility], [facility] ONLY)  TROPONIN I (HIGH SENSITIVITY)    DG Chest 2 View  Final Result      Medications  methylPREDNISolone sodium succinate (SOLU-MEDROL) 125 mg/2 mL injection 125 mg (125 mg Intravenous Given 05/05/22 0435)  sodium chloride 0.9 % bolus 1,000 mL (0 mLs Intravenous Stopped 05/05/22 0533)  famotidine (PEPCID) IVPB 20 mg premix (0 mg Intravenous Stopped 05/05/22 0534)     Procedures  /  Critical Care Procedures  ED Course and Medical Decision Making  I have reviewed the triage vital signs, the nursing notes, and pertinent available records from the EMR.  Social Determinants Affecting Complexity of Care: Patient has no clinically significant social determinants affecting this chief complaint..   ED Course:    Medical Decision Making Patient here with allergic reaction.  Uncertain trigger.  Has been having hives for about 3-4 days.  Has been managing symptoms with benadryl.  No distress.  No visible hives now.  Normal oropharynx. No wheezing.  Solumedrol and pepcid given.   5:45 AM Feeling better now.  DC home with prednisone and allergy follow-up.  Amount and/or Complexity of Data Reviewed Labs: ordered. Radiology: ordered. ECG/medicine tests: ordered.  Risk Prescription drug management.     Consultants: No consultations  were needed in caring for this patient.   Treatment and Plan: Emergency department workup does not suggest an emergent condition requiring admission or immediate intervention beyond  what has been performed at this time. The patient is safe for discharge and has  been instructed to return immediately for worsening symptoms, change in  symptoms or any other concerns    Final Clinical Impressions(s) / ED Diagnoses     ICD-10-CM   1. Allergic reaction, initial encounter  T78.40XA       ED Discharge Orders          Ordered    predniSONE  (DELTASONE) 20 MG tablet  Daily        05/05/22 0543    famotidine (PEPCID) 20 MG tablet  2 times daily        05/05/22 0543              Discharge Instructions Discussed with and Provided to Patient:   Discharge Instructions   None      Montine Circle, PA-C 05/05/22 [place], Morgan, DO 05/05/22 [phone]
